# Patient Record
Sex: Female | Born: 1965 | Race: White | Hispanic: No | Marital: Married | Smoking: Never smoker
Health system: Southern US, Community
[De-identification: ages and names within clinical notes are randomized; demographics above are authoritative.]

## PROBLEM LIST (undated history)

## (undated) DIAGNOSIS — Z86010 Personal history of colonic polyps: Secondary | ICD-10-CM

## (undated) DIAGNOSIS — E785 Hyperlipidemia, unspecified: Secondary | ICD-10-CM

## (undated) DIAGNOSIS — F4310 Post-traumatic stress disorder, unspecified: Secondary | ICD-10-CM

## (undated) DIAGNOSIS — I10 Essential (primary) hypertension: Secondary | ICD-10-CM

## (undated) DIAGNOSIS — S82892A Other fracture of left lower leg, initial encounter for closed fracture: Secondary | ICD-10-CM

## (undated) DIAGNOSIS — B977 Papillomavirus as the cause of diseases classified elsewhere: Secondary | ICD-10-CM

## (undated) DIAGNOSIS — E119 Type 2 diabetes mellitus without complications: Secondary | ICD-10-CM

## (undated) HISTORY — DX: Personal history of colonic polyps: Z86.010

## (undated) HISTORY — DX: Hyperlipidemia, unspecified: E78.5

## (undated) HISTORY — DX: Post-traumatic stress disorder, unspecified: F43.10

## (undated) HISTORY — DX: Type 2 diabetes mellitus without complications: E11.9

## (undated) HISTORY — DX: Essential (primary) hypertension: I10

## (undated) HISTORY — PX: CHOLECYSTECTOMY: SHX55

## (undated) HISTORY — DX: Other fracture of left lower leg, initial encounter for closed fracture: S82.892A

## (undated) HISTORY — DX: Papillomavirus as the cause of diseases classified elsewhere: B97.7

---

## 2015-04-03 ENCOUNTER — Encounter: Payer: Self-pay | Admitting: Family Medicine

## 2015-04-03 ENCOUNTER — Ambulatory Visit (INDEPENDENT_AMBULATORY_CARE_PROVIDER_SITE_OTHER): Payer: 59 | Admitting: Family Medicine

## 2015-04-03 VITALS — BP 125/85 | HR 98 | Ht 59.0 in | Wt 182.0 lb

## 2015-04-03 DIAGNOSIS — I10 Essential (primary) hypertension: Secondary | ICD-10-CM

## 2015-04-03 DIAGNOSIS — F4310 Post-traumatic stress disorder, unspecified: Secondary | ICD-10-CM

## 2015-04-03 HISTORY — DX: Post-traumatic stress disorder, unspecified: F43.10

## 2015-04-03 HISTORY — DX: Essential (primary) hypertension: I10
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
## 2015-04-04 ENCOUNTER — Encounter: Payer: Self-pay | Admitting: Family Medicine

## 2015-04-04 DIAGNOSIS — Z860100 Personal history of colon polyps, unspecified: Secondary | ICD-10-CM

## 2015-04-04 HISTORY — DX: Personal history of colon polyps, unspecified: Z86.0100

## 2015-04-04 HISTORY — DX: Personal history of colonic polyps: Z86.010

## 2015-04-08 ENCOUNTER — Telehealth: Payer: Self-pay | Admitting: *Deleted

## 2015-04-08 DIAGNOSIS — Z0000 Encounter for general adult medical examination without abnormal findings: Secondary | ICD-10-CM

## 2015-04-08 NOTE — Telephone Encounter (Signed)
labs

## 2015-04-10 ENCOUNTER — Ambulatory Visit (INDEPENDENT_AMBULATORY_CARE_PROVIDER_SITE_OTHER): Payer: 59 | Admitting: Family Medicine

## 2015-04-10 VITALS — BP 114/79 | HR 91

## 2015-04-10 DIAGNOSIS — Z111 Encounter for screening for respiratory tuberculosis: Secondary | ICD-10-CM

## 2015-04-10 NOTE — Progress Notes (Signed)
   Subjective:    Patient ID: Connie Dyer, female    DOB: Nov 27, 1966, 49 y.o.   MRN: [id number] Pt here for a tb skin test for a new job.  Given right arm.  Pt to return Fri for read & will have her paper work.  Donne AnonAmber Suhan Paci, CMA HPI    Review of Systems     Objective:   Physical Exam        Assessment & Plan:

## 2015-04-15 ENCOUNTER — Telehealth: Payer: Self-pay | Admitting: *Deleted

## 2015-04-15 DIAGNOSIS — Z0100 Encounter for examination of eyes and vision without abnormal findings: Secondary | ICD-10-CM

## 2015-04-15 NOTE — Telephone Encounter (Signed)
referral

## 2015-05-09 ENCOUNTER — Ambulatory Visit (INDEPENDENT_AMBULATORY_CARE_PROVIDER_SITE_OTHER): Payer: 59 | Admitting: Family Medicine

## 2015-05-09 ENCOUNTER — Encounter: Payer: Self-pay | Admitting: Family Medicine

## 2015-05-09 VITALS — BP 127/79 | HR 83 | Ht 59.0 in | Wt 184.0 lb

## 2015-05-09 DIAGNOSIS — Z0000 Encounter for general adult medical examination without abnormal findings: Secondary | ICD-10-CM | POA: Diagnosis not present

## 2015-05-09 DIAGNOSIS — Z1239 Encounter for other screening for malignant neoplasm of breast: Secondary | ICD-10-CM | POA: Diagnosis not present

## 2015-05-09 DIAGNOSIS — Z124 Encounter for screening for malignant neoplasm of cervix: Secondary | ICD-10-CM

## 2015-05-09 LAB — CBC
HEMATOCRIT: 44 % (ref 36.0–46.0)
Hemoglobin: 14.6 g/dL (ref 12.0–15.0)
MCH: 29.4 pg (ref 26.0–34.0)
MCHC: 33.2 g/dL (ref 30.0–36.0)
MCV: 88.7 fL (ref 78.0–100.0)
MPV: 9.7 fL (ref 8.6–12.4)
Platelets: 281 10*3/uL (ref 150–400)
RBC: 4.96 MIL/uL (ref 3.87–5.11)
RDW: 13.7 % (ref 11.5–15.5)
WBC: 7.4 10*3/uL (ref 4.0–10.5)

## 2015-05-09 LAB — COMPLETE METABOLIC PANEL WITH GFR
ALK PHOS: 18 U/L — AB (ref 39–117)
ALT: 20 U/L (ref 0–35)
AST: 18 U/L (ref 0–37)
Albumin: 4.7 g/dL (ref 3.5–5.2)
BILIRUBIN TOTAL: 0.7 mg/dL (ref 0.2–1.2)
BUN: 20 mg/dL (ref 6–23)
CO2: 25 mEq/L (ref 19–32)
CREATININE: 0.77 mg/dL (ref 0.50–1.10)
Calcium: 10.1 mg/dL (ref 8.4–10.5)
Chloride: 98 mEq/L (ref 96–112)
GFR, Est African American: >89 mL/min
GFR, Est Non African American: >89 mL/min
GLUCOSE: 149 mg/dL — AB (ref 70–99)
POTASSIUM: 4.7 meq/L (ref 3.5–5.3)
SODIUM: 133 meq/L — AB (ref 135–145)
Total Protein: 7.9 g/dL (ref 6.0–8.3)

## 2015-05-09 LAB — LIPID PANEL
Cholesterol: 227 mg/dL — ABNORMAL HIGH (ref 0–200)
HDL: 48 mg/dL (ref >=46)
LDL CALC: 158 mg/dL — AB (ref 0–99)
Total CHOL/HDL Ratio: 4.7 Ratio
Triglycerides: 107 mg/dL (ref <150)
VLDL: 21 mg/dL (ref 0–40)

## 2015-05-09 MED ORDER — SERTRALINE HCL 100 MG PO TABS: 100.0000 mg | ORAL_TABLET | Freq: Every day | ORAL | Status: DC

## 2015-05-09 MED ORDER — HYDROCHLOROTHIAZIDE 25 MG PO TABS: 25.0000 mg | ORAL_TABLET | Freq: Every day | ORAL | Status: DC

## 2015-05-09 MED ORDER — LISINOPRIL 10 MG PO TABS: 10.0000 mg | ORAL_TABLET | Freq: Every day | ORAL | Status: DC

## 2015-05-09 NOTE — Patient Instructions (Signed)
Dr. Sherod Cisse's General Advice Following Your Complete Physical Exam  The Benefits of Regular Exercise: Unless you suffer from an uncontrolled cardiovascular condition, studies strongly suggest that regular exercise and physical activity will add to both the quality and length of your life.  The World Health Organization recommends 150 minutes of moderate intensity aerobic activity every week.  This is best split over 3-4 days a week, and can be as simple as a brisk walk for just over 35 minutes "most days of the week".  This type of exercise has been shown to lower LDL-Cholesterol, lower average blood sugars, lower blood pressure, lower cardiovascular disease risk, improve memory, and increase one's overall sense of wellbeing.  The addition of anaerobic (or "strength training") exercises offers additional benefits including but not limited to increased metabolism, prevention of osteoporosis, and improved overall cholesterol levels.  How Can I Strive For A Low-Fat Diet?: Current guidelines recommend that 25-35 percent of your daily energy (food) intake should come from fats.  One might ask how can this be achieved without having to dissect each meal on a daily basis?  Switch to skim or 1% milk instead of whole milk.  Focus on lean meats such as ground turkey, fresh fish, baked chicken, and lean cuts of beef as your source of dietary protein.  Limit saturated fat consumption to less than 10% of your daily caloric intake.  Limit trans fatty acid consumption primarily by limiting synthetic trans fats such as partially hydrogenated oils (Ex: fried fast foods).  Substitute olive or vegetable oil for solid fats where possible.  Moderation of Salt Intake: Provided you don't carry a diagnosis of congestive heart failure nor renal failure, I recommend a daily allowance of no more than 2300 mg of salt (sodium).  Keeping under this daily goal is associated with a decreased risk of cardiovascular events, creeping  above it can lead to elevated blood pressures and increases your risk of cardiovascular events.  Milligrams (mg) of salt is listed on all nutrition labels, and your daily intake can add up faster than you think.  Most canned and frozen dinners can pack in over half your daily salt allowance in one meal.    Lifestyle Health Risks: Certain lifestyle choices carry specific health risks.  As you may already know, tobacco use has been associated with increasing one's risk of cardiovascular disease, pulmonary disease, numerous cancers, among many other issues.  What you may not know is that there are medications and nicotine replacement strategies that can more than double your chances of successfully quitting.  I would be thrilled to help manage your quitting strategy if you currently use tobacco products.  When it comes to alcohol use, I've yet to find an "ideal" daily allowance.  Provided an individual does not have a medical condition that is exacerbated by alcohol consumption, general guidelines determine "safe drinking" as no more than two standard drinks for a man or no more than one standard drink for a female per day.  However, much debate still exists on whether any amount of alcohol consumption is technically "safe".  My general advice, keep alcohol consumption to a minimum for general health promotion.  If you or others believe that alcohol, tobacco, or recreational drug use is interfering with your life, I would be happy to provide confidential counseling regarding treatment options.  General "Over The Counter" Nutrition Advice: Postmenopausal women should aim for a daily calcium intake of 1200 mg, however a significant portion of this might already be   provided by diets including milk, yogurt, cheese, and other dairy products.  Vitamin D has been shown to help preserve bone density, prevent fatigue, and has even been shown to help reduce falls in the elderly.  Ensuring a daily intake of 800 Units of  Vitamin D is a good place to start to enjoy the above benefits, we can easily check your Vitamin D level to see if you'd potentially benefit from supplementation beyond 800 Units a day.  Folic Acid intake should be of particular concern to women of childbearing age.  Daily consumption of 400-800 mcg of Folic Acid is recommended to minimize the chance of spinal cord defects in a fetus should pregnancy occur.    For many adults, accidents still remain one of the most common culprits when it comes to cause of death.  Some of the simplest but most effective preventitive habits you can adopt include regular seatbelt use, proper helmet use, securing firearms, and regularly testing your smoke and carbon monoxide detectors.  Ruble Buttler B. Shateka Petrea DO [facility] [address], [address] [place], [place] [address] Phone: [phone]  

## 2015-05-09 NOTE — Progress Notes (Signed)
CC: Sherie Dobrowolski is a 49 y.o. female is here for Annual Exam   Subjective: HPI:  Colonoscopy: ten year clearance after 2015 Papsmear: She believes has been more than 3-5 years since her last Pap, will obtain today Mammogram: Last mammogram was 2 years ago and has no history of abnormals. Orders have been placed for mammogram  Influenza Vaccine: No current indication Pneumovax: No current indication Td/Tdap: UTD Zoster: (Start 51 yo)   No acute complaints  Review of Systems - General ROS: negative for - chills, fever, night sweats, weight gain or weight loss Ophthalmic ROS: negative for - decreased vision Psychological ROS: negative for - anxiety or depression ENT ROS: negative for - hearing change, nasal congestion, tinnitus or allergies Hematological and Lymphatic ROS: negative for - bleeding problems, bruising or swollen lymph nodes Breast ROS: negative Respiratory ROS: no cough, shortness of breath, or wheezing Cardiovascular ROS: no chest pain or dyspnea on exertion Gastrointestinal ROS: no abdominal pain, change in bowel habits, or black or bloody stools Genito-Urinary ROS: negative for - genital discharge, genital ulcers, incontinence or abnormal bleeding from genitals Musculoskeletal ROS: negative for - joint pain or muscle pain Neurological ROS: negative for - headaches or memory loss Dermatological ROS: negative for lumps, mole changes, rash and skin lesion changes  No past medical history on file.  Past Surgical History  Procedure Laterality Date  . Cholecystectomy     No family history on file.  History   Social History  . Marital Status: Married    Spouse Name: N/A  . Number of Children: N/A  . Years of Education: N/A   Occupational History  . Not on file.   Social History Main Topics  . Smoking status: Never Smoker   . Smokeless tobacco: Not on file  . Alcohol Use: Not on file  . Drug Use: Not on file  . Sexual Activity: Not on file   Other  Topics Concern  . Not on file   Social History Narrative     Objective: BP 127/79 mmHg  Pulse 83  Ht  (1.499 m)  Wt 184 lb (83.462 kg)  BMI 37.14 kg/m2  General: No Acute Distress HEENT: Atraumatic, normocephalic, conjunctivae normal without scleral icterus.  No nasal discharge, hearing grossly intact, TMs with good landmarks bilaterally with no middle ear abnormalities, posterior pharynx clear without oral lesions. Neck: Supple, trachea midline, no cervical nor supraclavicular adenopathy. Pulmonary: Clear to auscultation bilaterally without wheezing, rhonchi, nor rales. Cardiac: Regular rate and rhythm.  No murmurs, rubs, nor gallops. No peripheral edema.  2+ peripheral pulses bilaterally. Abdomen: Bowel sounds normal.  No masses.  Non-tender without rebound.  Negative Murphy's sign. GU:  Labia majora & minora without lesions.  Distal urethra unremarkable.  Vaginal wall integrity preserved without mucosal lesions.  Cervix non-tender and without gross lesions nor discharge.  MSK: Grossly intact, no signs of weakness.  Full strength throughout upper and lower extremities.  Full ROM in upper and lower extremities.  No midline spinal tenderness. Neuro: Gait unremarkable, CN II-XII grossly intact.  C5-C6 Reflex 2/4 Bilaterally, L4 Reflex 2/4 Bilaterally.  Cerebellar function intact. Skin: No rashes. Psych: Alert and oriented to person/place/time.  Thought process normal. No anxiety/depression.   Assessment & Plan: Connie Dyer was seen today for annual exam.  Diagnoses and all orders for this visit:  Annual physical exam Orders: -     Lipid panel -     CBC -     COMPLETE METABOLIC PANEL WITH GFR -  MM DIGITAL SCREENING BILATERAL; Future  Screening for breast cancer Orders: -     Lipid panel -     CBC -     COMPLETE METABOLIC PANEL WITH GFR -     MM DIGITAL SCREENING BILATERAL; Future  Other orders -     hydrochlorothiazide (HYDRODIURIL) 25 MG tablet; Take 1 tablet (25  mg total) by mouth daily. -     lisinopril (PRINIVIL,ZESTRIL) 10 MG tablet; Take 1 tablet (10 mg total) by mouth daily. -     sertraline (ZOLOFT) 100 MG tablet; Take 1 tablet (100 mg total) by mouth daily. Healthy lifestyle interventions including but not limited to regular exercise, a healthy low fat diet, moderation of salt intake, the dangers of tobacco/alcohol/recreational drug use, nutrition supplementation, and accident avoidance were discussed with the patient and a handout was provided for future reference.  Due for refills on antihypertensives and Zoloft   Return in about 6 months (around 11/08/2015) for Blood pressure.

## 2015-05-10 ENCOUNTER — Telehealth: Payer: Self-pay | Admitting: Family Medicine

## 2015-05-10 ENCOUNTER — Other Ambulatory Visit (HOSPITAL_COMMUNITY)
Admission: RE | Admit: 2015-05-10 | Discharge: 2015-05-10 | Disposition: A | Discharge status: Home / Self Care | Payer: 59 | Source: Ambulatory Visit | Attending: Family Medicine | Admitting: Family Medicine

## 2015-05-10 DIAGNOSIS — Z01419 Encounter for gynecological examination (general) (routine) without abnormal findings: Secondary | ICD-10-CM | POA: Insufficient documentation

## 2015-05-10 DIAGNOSIS — R739 Hyperglycemia, unspecified: Secondary | ICD-10-CM | POA: Insufficient documentation

## 2015-05-10 DIAGNOSIS — E785 Hyperlipidemia, unspecified: Secondary | ICD-10-CM

## 2015-05-10 DIAGNOSIS — R87810 Cervical high risk human papillomavirus (HPV) DNA test positive: Secondary | ICD-10-CM | POA: Diagnosis present

## 2015-05-10 DIAGNOSIS — Z1151 Encounter for screening for human papillomavirus (HPV): Secondary | ICD-10-CM | POA: Diagnosis present

## 2015-05-10 HISTORY — DX: Hyperlipidemia, unspecified: E78.5

## 2015-05-10 NOTE — Addendum Note (Signed)
Addended by: Deno Etienne on: 05/10/2015 12:23 PM   Modules accepted: Orders

## 2015-05-10 NOTE — Telephone Encounter (Signed)
Left message on patient vm with results as noted below. Verlie Liotta,CMA  

## 2015-05-10 NOTE — Telephone Encounter (Signed)
Typo: left message on patient vm to call back regarding lab results. Sherri Levenhagen,CMA

## 2015-05-10 NOTE — Telephone Encounter (Signed)
Connie Dyer, Will you please let patient know that her blood cell counts, kidney function, and liver function were all normal.  Her blood sugar was moderately elevated and I'd recommend she have a three month average blood sugar test, A1c order in your inbox in case the lab can't add it on.  Also her LDL cholesterol was moderately elevated however with her lack of cardiac risk factors cholesterol medication is not needed at this time but this may change as she gets older.  This can be improved with engaging in 30-45 minutes of moderate exercise most days of the week. 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

## 2015-05-13 NOTE — Telephone Encounter (Signed)
Message with results left on vm 

## 2015-05-14 LAB — CYTOLOGY - PAP

## 2015-05-15 ENCOUNTER — Encounter: Payer: Self-pay | Admitting: Family Medicine

## 2015-05-15 DIAGNOSIS — B977 Papillomavirus as the cause of diseases classified elsewhere: Secondary | ICD-10-CM

## 2015-05-15 HISTORY — DX: Papillomavirus as the cause of diseases classified elsewhere: B97.7

## 2015-07-23 ENCOUNTER — Ambulatory Visit (INDEPENDENT_AMBULATORY_CARE_PROVIDER_SITE_OTHER): Payer: 59 | Admitting: Family Medicine

## 2015-07-23 ENCOUNTER — Encounter: Payer: Self-pay | Admitting: Family Medicine

## 2015-07-23 VITALS — BP 126/83 | HR 84 | Wt 184.0 lb

## 2015-07-23 DIAGNOSIS — R739 Hyperglycemia, unspecified: Secondary | ICD-10-CM | POA: Diagnosis not present

## 2015-07-23 DIAGNOSIS — R109 Unspecified abdominal pain: Secondary | ICD-10-CM | POA: Diagnosis not present

## 2015-07-23 LAB — POCT URINALYSIS DIPSTICK
Bilirubin, UA: NEGATIVE
Glucose, UA: NEGATIVE
KETONES UA: NEGATIVE
Leukocytes, UA: NEGATIVE
Nitrite, UA: NEGATIVE
PH UA: 6
Protein, UA: NEGATIVE
RBC UA: NEGATIVE
SPEC GRAV UA: 1.015
Urobilinogen, UA: 0.2

## 2015-07-23 LAB — HEMOGLOBIN A1C
HEMOGLOBIN A1C: 7.4 % — AB (ref <5.7)
Mean Plasma Glucose: 166 mg/dL — ABNORMAL HIGH (ref <117)

## 2015-07-23 MED ORDER — NITROFURANTOIN MONOHYD MACRO 100 MG PO CAPS
100.0000 mg | ORAL_CAPSULE | Freq: Two times a day (BID) | ORAL | Status: AC
Start: 2015-07-23 — End: 2015-08-02

## 2015-07-23 NOTE — Progress Notes (Signed)
CC: Connie Dyer is a 49 y.o. female is here for abdominal discomfort x 3 days   Subjective: HPI:  Dysuria, urinary urgency, awakening more than usual at night to urinate. Accompanied by lower abdominal pain and pelvic discomfort that feels like past UTIs. Interventions have included increasing fluid in the diet, nothing seems to make symptoms better or worse. She feels like it's been present for 3 days and not getting better or worse. Symptoms are mild to moderate in severity. Present all hours of the day. Denies fevers, chills, constipation, diarrhea, nor any other genitourinary complaints. No flank pain.    Review Of Systems Outlined In HPI  No past medical history on file.  Past Surgical History  Procedure Laterality Date  . Cholecystectomy     No family history on file.  Social History   Social History  . Marital Status: Married    Spouse Name: N/A  . Number of Children: N/A  . Years of Education: N/A   Occupational History  . Not on file.   Social History Main Topics  . Smoking status: Never Smoker   . Smokeless tobacco: Not on file  . Alcohol Use: Not on file  . Drug Use: Not on file  . Sexual Activity: Not on file   Other Topics Concern  . Not on file   Social History Narrative     Objective: BP 126/83 mmHg  Pulse 84  Wt 184 lb (83.462 kg)  Vital signs reviewed. General: Alert and Oriented, No Acute Distress HEENT: Pupils equal, round, reactive to light. Conjunctivae clear.  External ears unremarkable.  Moist mucous membranes. Lungs: Clear and comfortable work of breathing, speaking in full sentences without accessory muscle use. Cardiac: Regular rate and rhythm.  Neuro: CN II-XII grossly intact, gait normal. Extremities: No peripheral edema.  Strong peripheral pulses.  Mental Status: No depression, anxiety, nor agitation. Logical though process. Skin: Warm and dry.  Assessment & Plan: Connie Dyer was seen today for abdominal discomfort x 3  days.  Diagnoses and all orders for this visit:  Abdominal discomfort -     Urinalysis Dipstick -     Urine Culture  Hyperglycemia -     Hemoglobin A1c  Other orders -     nitrofurantoin, macrocrystal-monohydrate, (MACROBID) 100 MG capsule; Take 1 capsule (100 mg total) by mouth 2 (two) times daily.   Symptoms are suspicious for UTI, since her analysis was inconclusive sending urine culture. Start Macrobid pending results. Overdue for A1c checked due to hyperglycemia when she was here earlier this summer.  Return if symptoms worsen or fail to improve.

## 2015-07-24 ENCOUNTER — Encounter: Payer: Self-pay | Admitting: Family Medicine

## 2015-07-24 DIAGNOSIS — E119 Type 2 diabetes mellitus without complications: Secondary | ICD-10-CM | POA: Insufficient documentation

## 2015-07-25 LAB — URINE CULTURE

## 2015-11-18 ENCOUNTER — Telehealth: Payer: Self-pay

## 2015-11-18 ENCOUNTER — Ambulatory Visit: Payer: 59 | Admitting: Family Medicine

## 2015-11-18 DIAGNOSIS — S82892A Other fracture of left lower leg, initial encounter for closed fracture: Secondary | ICD-10-CM

## 2015-11-18 NOTE — Telephone Encounter (Signed)
Sent a referral for left ankle fracture. She was [facility].

## 2015-11-26 ENCOUNTER — Encounter: Payer: Self-pay | Admitting: Family Medicine

## 2015-11-26 ENCOUNTER — Ambulatory Visit (INDEPENDENT_AMBULATORY_CARE_PROVIDER_SITE_OTHER): Payer: 59 | Admitting: Family Medicine

## 2015-11-26 VITALS — BP 126/66 | HR 99 | Wt 183.0 lb

## 2015-11-26 DIAGNOSIS — T148 Other injury of unspecified body region: Secondary | ICD-10-CM

## 2015-11-26 DIAGNOSIS — IMO0002 Reserved for concepts with insufficient information to code with codable children: Secondary | ICD-10-CM

## 2015-11-26 NOTE — Patient Instructions (Signed)
Thank you for coming in today. Return to Dr. Rexene EdisonH as needed.  We will be happy to take over the ankle if desired.

## 2015-11-26 NOTE — Progress Notes (Signed)
       Connie Dyer is a 49 y.o. female who [facility] today for staple removal.  Patient sustained a fall on the 18th suffering from a R gluteal fold laceration repaired with 3 staples and a L ankle fracture. She does not note any purulence or bleeding from laceration site. Denies fevers and chills. Patient is irritated by the staples while sitting and is scheduled to have them removed. Patient is being followed for her fracture with Dr. Orson AloeHenderson.    No past medical history on file. Past Surgical History  Procedure Laterality Date  . Cholecystectomy     Social History  Substance Use Topics  . Smoking status: Never Smoker   . Smokeless tobacco: Not on file  . Alcohol Use: Not on file   family history is not on file.  ROS as above Medications: Current Outpatient Prescriptions  Medication Sig Dispense Refill  . hydrochlorothiazide (HYDRODIURIL) 25 MG tablet Take 1 tablet (25 mg total) by mouth daily. 90 tablet 2  . lisinopril (PRINIVIL,ZESTRIL) 10 MG tablet Take 1 tablet (10 mg total) by mouth daily. 90 tablet 2  . sertraline (ZOLOFT) 100 MG tablet Take 1 tablet (100 mg total) by mouth daily. 90 tablet 2   No current facility-administered medications for this visit.   No Known Allergies   Exam:  BP 126/66 mmHg  Pulse 99  Wt 183 lb (83.008 kg) Gen: Well NAD Left ankle: Well appearing short leg cast.  Skin: Appearing 3 staples easily removed. No skin exudates or significant erythema or tenderness  No results found for this or any previous visit (from the past 24 hour(s)). No results found.   49 year old woman with laceration status post staple removal. Doing well. Return as needed. Ankle fracture doing well. Continue management per orthopedics for with myself

## 2015-12-03 ENCOUNTER — Ambulatory Visit (INDEPENDENT_AMBULATORY_CARE_PROVIDER_SITE_OTHER): Payer: Managed Care, Other (non HMO)

## 2015-12-03 ENCOUNTER — Encounter: Payer: Self-pay | Admitting: Family Medicine

## 2015-12-03 ENCOUNTER — Ambulatory Visit (INDEPENDENT_AMBULATORY_CARE_PROVIDER_SITE_OTHER): Payer: Managed Care, Other (non HMO) | Admitting: Family Medicine

## 2015-12-03 VITALS — BP 126/54 | HR 102 | Wt 183.0 lb

## 2015-12-03 DIAGNOSIS — X58XXXD Exposure to other specified factors, subsequent encounter: Secondary | ICD-10-CM

## 2015-12-03 DIAGNOSIS — S82432D Displaced oblique fracture of shaft of left fibula, subsequent encounter for closed fracture with routine healing: Secondary | ICD-10-CM

## 2015-12-03 DIAGNOSIS — S82892D Other fracture of left lower leg, subsequent encounter for closed fracture with routine healing: Secondary | ICD-10-CM | POA: Diagnosis not present

## 2015-12-03 DIAGNOSIS — S82892A Other fracture of left lower leg, initial encounter for closed fracture: Secondary | ICD-10-CM

## 2015-12-03 HISTORY — DX: Other fracture of left lower leg, initial encounter for closed fracture: S82.892A

## 2015-12-03 NOTE — Patient Instructions (Addendum)
Thank you for coming in today. We think the fracture of your ankle is displaced and you should see a surgeon about options for management. Please contact the office below to ensure oyu have an appointment for tomorrow 1/4 at 1pm.   Delbert HarnessMurphy [facility]: Nilda SimmerWainer Robert A MD Address: [address], [place], [place] [address]  [phone]

## 2015-12-03 NOTE — Progress Notes (Signed)
       Connie Dyer is a 50 y.o. female who [facility] today for discuss ankle fracture. She was recently seen as a work in visit to remove a stable and her buttocks. She presents today to manage her ankle fracture. She fell on or about December 18 and fractured her left ankle. She has been managed with casting and nonweightbearing status. She's here today for infection follow-up. She notes the pain is moderate but throbbing at times. Elevation of her foot is helpful. 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   No past medical history on file. Past Surgical History  Procedure Laterality Date  . Cholecystectomy     Social History  Substance Use Topics  . Smoking status: Never Smoker   . Smokeless tobacco: Not on file  . Alcohol Use: Not on file   family history is not on file.  ROS as above Medications: Current Outpatient Prescriptions  Medication Sig Dispense Refill  . hydrochlorothiazide (HYDRODIURIL) 25 MG tablet Take 1 tablet (25 mg total) by mouth daily. 90 tablet 2  . lisinopril (PRINIVIL,ZESTRIL) 10 MG tablet Take 1 tablet (10 mg total) by mouth daily. 90 tablet 2  . sertraline (ZOLOFT) 100 MG tablet Take 1 tablet (100 mg total) by mouth daily. 90 tablet 2   No current facility-administered medications for this visit.   No Known Allergies   Exam:  BP 126/54 mmHg  Pulse 102  Wt 183 lb (83.008 kg)  Gen: Well NAD This ankle with mild ecchymosis. Swelling is minimal. Pulses capillary refill and sensation are intact distally.  No results found for this or any previous visit (from the past 24 hour(s)). Dg Ankle Complete Left  12/03/2015  CLINICAL DATA:  Follow-up left ankle fracture. Fracture diagnosed 2 weeks ago. EXAM: LEFT ANKLE COMPLETE - 3+ VIEW COMPARISON:  None. FINDINGS: There is an oblique fracture of the distal fibula.  Fractures non comminuted. There is minimal posterior and lateral displacement 2.7 mm. No medial malleolar fracture. Ankle mortise is normally spaced and aligned. There is a subtle nondisplaced fracture of the posterior medially this, along the posterior margin of the distal tibia, without comminution. Mild soft tissue edema is noted. IMPRESSION: 1. Oblique fracture of the distal fibula, non comminuted, with minimal, 2.7 mm, of displacement. 2. Subtle nondisplaced fracture of the posterior malleolus. 3. No other fractures.  Ankle mortise is normally aligned. Electronically Signed   By: Amie Portlandavid  Ormond M.D.   On: 12/03/2015 15:11     Please see individual assessment and plan sections.

## 2015-12-03 NOTE — Assessment & Plan Note (Signed)
Displaced and disrupting the mortise on x-ray today. D[facility]ed with [facility] [place]. Referral to Dr. Thurston HoleWainer for surgery.

## 2016-02-10 ENCOUNTER — Other Ambulatory Visit: Payer: Self-pay

## 2016-02-10 MED ORDER — LISINOPRIL 10 MG PO TABS: 10.0000 mg | ORAL_TABLET | Freq: Every day | ORAL | Status: DC

## 2016-02-10 MED ORDER — HYDROCHLOROTHIAZIDE 25 MG PO TABS: 25.0000 mg | ORAL_TABLET | Freq: Every day | ORAL | Status: DC

## 2016-02-10 MED ORDER — SERTRALINE HCL 100 MG PO TABS: 100.0000 mg | ORAL_TABLET | Freq: Every day | ORAL | Status: DC

## 2016-02-19 ENCOUNTER — Other Ambulatory Visit: Payer: Self-pay

## 2016-02-19 MED ORDER — SERTRALINE HCL 100 MG PO TABS: 100.0000 mg | ORAL_TABLET | Freq: Every day | ORAL | Status: DC

## 2016-02-19 MED ORDER — LISINOPRIL 10 MG PO TABS: 10.0000 mg | ORAL_TABLET | Freq: Every day | ORAL | Status: DC

## 2016-02-19 MED ORDER — HYDROCHLOROTHIAZIDE 25 MG PO TABS: 25.0000 mg | ORAL_TABLET | Freq: Every day | ORAL | Status: DC

## 2016-03-03 ENCOUNTER — Other Ambulatory Visit: Payer: Self-pay | Admitting: Family Medicine

## 2016-03-05 ENCOUNTER — Telehealth: Payer: Self-pay | Admitting: Family Medicine

## 2016-03-05 NOTE — Telephone Encounter (Signed)
I called pt and left a message for patient to call the office to schedule appt with Dr.Hommel for a f/u on her HTN and DM

## 2016-03-10 ENCOUNTER — Ambulatory Visit (INDEPENDENT_AMBULATORY_CARE_PROVIDER_SITE_OTHER): Payer: Managed Care, Other (non HMO) | Admitting: Family Medicine

## 2016-03-10 ENCOUNTER — Encounter: Payer: Self-pay | Admitting: Family Medicine

## 2016-03-10 DIAGNOSIS — Z0289 Encounter for other administrative examinations: Secondary | ICD-10-CM

## 2016-03-10 NOTE — Progress Notes (Signed)
No show 03/10/2016

## 2016-03-16 ENCOUNTER — Encounter: Payer: Self-pay | Admitting: Family Medicine

## 2016-03-16 ENCOUNTER — Ambulatory Visit (INDEPENDENT_AMBULATORY_CARE_PROVIDER_SITE_OTHER): Payer: Managed Care, Other (non HMO) | Admitting: Family Medicine

## 2016-03-16 VITALS — BP 127/84 | HR 96 | Wt 178.0 lb

## 2016-03-16 DIAGNOSIS — E119 Type 2 diabetes mellitus without complications: Secondary | ICD-10-CM

## 2016-03-16 DIAGNOSIS — S93402A Sprain of unspecified ligament of left ankle, initial encounter: Secondary | ICD-10-CM

## 2016-03-16 DIAGNOSIS — R739 Hyperglycemia, unspecified: Secondary | ICD-10-CM | POA: Diagnosis not present

## 2016-03-16 DIAGNOSIS — I10 Essential (primary) hypertension: Secondary | ICD-10-CM

## 2016-03-16 LAB — POCT GLYCOSYLATED HEMOGLOBIN (HGB A1C): HEMOGLOBIN A1C: 7.1

## 2016-03-16 MED ORDER — LISINOPRIL 10 MG PO TABS: 10.0000 mg | ORAL_TABLET | Freq: Every day | ORAL | Status: DC

## 2016-03-16 MED ORDER — HYDROCHLOROTHIAZIDE 25 MG PO TABS: 25.0000 mg | ORAL_TABLET | Freq: Every day | ORAL | Status: DC

## 2016-03-16 MED ORDER — SERTRALINE HCL 100 MG PO TABS: 100.0000 mg | ORAL_TABLET | Freq: Every day | ORAL | Status: DC

## 2016-03-16 NOTE — Progress Notes (Signed)
CC: Connie Dyer is a 50 y.o. female is here for Medication Refill and Ankle Pain   Subjective: HPI:  Follow-up essential hypertension: She is taking lisinopril and hydrochlorothiazide with 100% compliance with no outside blood pressures to report. She denies chest pain shortness of breath orthopnea nor peripheral edema. She is trying to stay active  Follow-up hyperglycemia: She's been trying to cut out carbohydrates in her diet. She denies vision loss, polyuria or polyphagia or polydipsia.  Over this weekend she sustained an inversion injury hiking that affected her left ankle. She was able to ambulate at the time of the accident and can ambulate today. Pain is localized just inferior and anterior to the medial malleolus. Its painful with any inversion or twisting of the ankle. She denies any pain with plantar or dorsiflexion. There's been some mild bruising that just showed up this morning. She denies any difficulty with bearing weight. Denies joint pain elsewhere   Review Of Systems Outlined In HPI  No past medical history on file.  Past Surgical History  Procedure Laterality Date  . Cholecystectomy     No family history on file.  Social History   Social History  . Marital Status: Married    Spouse Name: N/A  . Number of Children: N/A  . Years of Education: N/A   Occupational History  . Not on file.   Social History Main Topics  . Smoking status: Never Smoker   . Smokeless tobacco: Not on file  . Alcohol Use: Not on file  . Drug Use: Not on file  . Sexual Activity: Not on file   Other Topics Concern  . Not on file   Social History Narrative     Objective: BP 127/84 mmHg  Pulse 96  Wt 178 lb (80.74 kg)  General: Alert and Oriented, No Acute Distress HEENT: Pupils equal, round, reactive to light. Conjunctivae clear.  Moist mucous membranes Lungs: Clear to auscultation bilaterally, no wheezing/ronchi/rales.  Comfortable work of breathing. Good air  movement. Cardiac: Regular rate and rhythm. Normal S1/S2.  No murmurs, rubs, nor gallops.   Extremities: No peripheral edema.  Strong peripheral pulses. Pain is reproducible with palpation of the left ATF ligament. No pain at the lateral malleoli. Full range of motion and strength of the left ankle.  Mental Status: No depression, anxiety, nor agitation. Skin: Warm and dry.  Assessment & Plan: Connie Dyer was seen today for medication refill and ankle pain.  Diagnoses and all orders for this visit:  Essential hypertension -     hydrochlorothiazide (HYDRODIURIL) 25 MG tablet; Take 1 tablet (25 mg total) by mouth daily. -     lisinopril (PRINIVIL,ZESTRIL) 10 MG tablet; Take 1 tablet (10 mg total) by mouth daily.  Hyperglycemia  Type 2 diabetes mellitus without complication, without long-term current use of insulin (HCC)  Left ankle sprain, initial encounter  Other orders -     sertraline (ZOLOFT) 100 MG tablet; Take 1 tablet (100 mg total) by mouth daily.   Essential hypertension: Controlled with hydrochlorothiazide and lisinopril Hyperglycemia: A1c of 7.1, uncontrolled type 2 diabetes therefore stressed the importance of reducing carbohydrate intake and increasing physical activity once the ankle gets better. Left ankle sprain: Low suspicion for fracture return of the left ankle therefore discussed range of motion exercises and RICE  Return in about 3 months (around 06/15/2016) for Blood sugar and presssure.

## 2016-03-16 NOTE — Addendum Note (Signed)
Addended by: Thom ChimesHENRY, Sunya Humbarger M on: 03/16/2016 09:44 AM   Modules accepted: Orders

## 2016-03-18 ENCOUNTER — Ambulatory Visit (INDEPENDENT_AMBULATORY_CARE_PROVIDER_SITE_OTHER): Payer: Managed Care, Other (non HMO)

## 2016-03-18 DIAGNOSIS — Z1231 Encounter for screening mammogram for malignant neoplasm of breast: Secondary | ICD-10-CM

## 2016-03-18 DIAGNOSIS — Z1239 Encounter for other screening for malignant neoplasm of breast: Secondary | ICD-10-CM

## 2016-03-18 DIAGNOSIS — Z0000 Encounter for general adult medical examination without abnormal findings: Secondary | ICD-10-CM

## 2016-04-01 ENCOUNTER — Ambulatory Visit (INDEPENDENT_AMBULATORY_CARE_PROVIDER_SITE_OTHER): Payer: Managed Care, Other (non HMO) | Admitting: Family Medicine

## 2016-04-01 ENCOUNTER — Encounter: Payer: Self-pay | Admitting: Family Medicine

## 2016-04-01 VITALS — BP 145/83 | HR 90 | Temp 98.4°F | Wt 182.0 lb

## 2016-04-01 DIAGNOSIS — J069 Acute upper respiratory infection, unspecified: Secondary | ICD-10-CM | POA: Diagnosis not present

## 2016-04-01 NOTE — Progress Notes (Signed)
CC: Connie Dyer is a 50 y.o. female is here for URI   Subjective: HPI:  Scratchy throat, nonproductive cough, fatigue present ever since Monday night. Not getting better or worse. She's felt fatigued to the point where she is unable to do simple things at work. She denies fevers but may be experiencing some chills. She denies any shortness of breath or wheezing. No chest discomfort. She denies any blood in sputum. She denies any facial pressure or nasal congestion. No benefit from Coricidin. No other interventions as of yet. Symptoms are worse at night.   Review Of Systems Outlined In HPI  No past medical history on file.  Past Surgical History  Procedure Laterality Date  . Cholecystectomy     No family history on file.  Social History   Social History  . Marital Status: Married    Spouse Name: N/A  . Number of Children: N/A  . Years of Education: N/A   Occupational History  . Not on file.   Social History Main Topics  . Smoking status: Never Smoker   . Smokeless tobacco: Not on file  . Alcohol Use: Not on file  . Drug Use: Not on file  . Sexual Activity: Not on file   Other Topics Concern  . Not on file   Social History Narrative     Objective: BP 145/83 mmHg  Pulse 90  Temp(Src) 98.4 F (36.9 C) (Oral)  Wt 182 lb (82.555 kg)  SpO2 98%  LMP 03/04/2016  General: Alert and Oriented, No Acute Distress HEENT: Pupils equal, round, reactive to light. Conjunctivae clear.  External ears unremarkable, canals clear with intact TMs with appropriate landmarks.  Middle ear appears open without effusion. Pink inferior turbinates.  Moist mucous membranes, pharynx without inflammation nor lesions.  Neck supple without palpable lymphadenopathy nor abnormal masses. Lungs: Clear to auscultation bilaterally, no wheezing/ronchi/rales.  Comfortable work of breathing. Good air movement. Cardiac: Regular rate and rhythm. Normal S1/S2.  No murmurs, rubs, nor gallops.   Extremities:  No peripheral edema.  Strong peripheral pulses.  Mental Status: No depression, anxiety, nor agitation. Skin: Warm and dry.  Assessment & Plan: Connie Dyer was seen today for uri.  Diagnoses and all orders for this visit:  Viral URI   Discussed vitamin C, zinc, fluids, and rest. 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  Return if symptoms worsen or fail to improve.

## 2016-06-10 ENCOUNTER — Ambulatory Visit: Payer: Managed Care, Other (non HMO) | Admitting: Family Medicine

## 2016-10-19 ENCOUNTER — Ambulatory Visit (INDEPENDENT_AMBULATORY_CARE_PROVIDER_SITE_OTHER): Payer: Managed Care, Other (non HMO) | Admitting: Family Medicine

## 2016-10-19 ENCOUNTER — Encounter: Payer: Self-pay | Admitting: Family Medicine

## 2016-10-19 VITALS — BP 125/86 | HR 86 | Temp 98.1°F | Wt 175.0 lb

## 2016-10-19 DIAGNOSIS — I10 Essential (primary) hypertension: Secondary | ICD-10-CM

## 2016-10-19 DIAGNOSIS — H8112 Benign paroxysmal vertigo, left ear: Secondary | ICD-10-CM

## 2016-10-19 DIAGNOSIS — E119 Type 2 diabetes mellitus without complications: Secondary | ICD-10-CM | POA: Diagnosis not present

## 2016-10-19 DIAGNOSIS — Z1159 Encounter for screening for other viral diseases: Secondary | ICD-10-CM

## 2016-10-19 DIAGNOSIS — E782 Mixed hyperlipidemia: Secondary | ICD-10-CM | POA: Diagnosis not present

## 2016-10-19 DIAGNOSIS — Z114 Encounter for screening for human immunodeficiency virus [HIV]: Secondary | ICD-10-CM

## 2016-10-19 LAB — LIPID PANEL
CHOLESTEROL: 205 mg/dL — AB (ref <200)
HDL: 44 mg/dL — AB (ref >50)
LDL CALC: 131 mg/dL — AB (ref <100)
TRIGLYCERIDES: 149 mg/dL (ref <150)
Total CHOL/HDL Ratio: 4.7 Ratio (ref <5.0)
VLDL: 30 mg/dL (ref <30)

## 2016-10-19 LAB — COMPLETE METABOLIC PANEL WITH GFR
ALBUMIN: 4.7 g/dL (ref 3.6–5.1)
ALK PHOS: 19 U/L — AB (ref 33–130)
ALT: 14 U/L (ref 6–29)
AST: 16 U/L (ref 10–35)
BUN: 14 mg/dL (ref 7–25)
CALCIUM: 9.5 mg/dL (ref 8.6–10.4)
CO2: 27 mmol/L (ref 20–31)
Chloride: 98 mmol/L (ref 98–110)
Creat: 0.8 mg/dL (ref 0.50–1.05)
GFR, EST NON AFRICAN AMERICAN: 85 mL/min (ref >=60)
Glucose, Bld: 193 mg/dL — ABNORMAL HIGH (ref 65–99)
POTASSIUM: 4.2 mmol/L (ref 3.5–5.3)
Sodium: 134 mmol/L — ABNORMAL LOW (ref 135–146)
Total Bilirubin: 0.6 mg/dL (ref 0.2–1.2)
Total Protein: 7.9 g/dL (ref 6.1–8.1)

## 2016-10-19 LAB — HIV ANTIBODY (ROUTINE TESTING W REFLEX): HIV 1&2 Ab, 4th Generation: NONREACTIVE

## 2016-10-19 LAB — CBC
HEMATOCRIT: 45.6 % — AB (ref 35.0–45.0)
HEMOGLOBIN: 15 g/dL (ref 11.7–15.5)
MCH: 30.2 pg (ref 27.0–33.0)
MCHC: 32.9 g/dL (ref 32.0–36.0)
MCV: 91.9 fL (ref 80.0–100.0)
MPV: 9.7 fL (ref 7.5–12.5)
Platelets: 265 10*3/uL (ref 140–400)
RBC: 4.96 MIL/uL (ref 3.80–5.10)
RDW: 13.1 % (ref 11.0–15.0)
WBC: 7.7 10*3/uL (ref 3.8–10.8)

## 2016-10-19 LAB — HEPATITIS C ANTIBODY: HCV Ab: NEGATIVE

## 2016-10-19 MED ORDER — PROMETHAZINE HCL 25 MG PO TABS: 25.0000 mg | ORAL_TABLET | Freq: Three times a day (TID) | ORAL | 0 refills | Status: DC | PRN

## 2016-10-19 MED ORDER — IPRATROPIUM BROMIDE 0.06 % NA SOLN: 2.0000 | NASAL | 6 refills | Status: DC | PRN

## 2016-10-19 NOTE — Patient Instructions (Addendum)
Thank you for coming in today. Use Phenergan sparingly for nausea or vomiting or dizziness. Use Atrovent nasal spray. Follow-up with vestibular physical therapy if not better.    Benign Positional Vertigo Introduction Vertigo is the feeling that you or your surroundings are moving when they are not. Benign positional vertigo is the most common form of vertigo. The cause of this condition is not serious (is benign). This condition is triggered by certain movements and positions (is positional). This condition can be dangerous if it occurs while you are doing something that could endanger you or others, such as driving. What are the causes? In many cases, the cause of this condition is not known. It may be caused by a disturbance in an area of the inner ear that helps your brain to sense movement and balance. This disturbance can be caused by a viral infection (labyrinthitis), head injury, or repetitive motion. What increases the risk? This condition is more likely to develop in:  Women.  People who are 52 years of age or older. What are the signs or symptoms? Symptoms of this condition usually happen when you move your head or your eyes in different directions. Symptoms may start suddenly, and they usually last for less than a minute. Symptoms may include:  Loss of balance and falling.  Feeling like you are spinning or moving.  Feeling like your surroundings are spinning or moving.  Nausea and vomiting.  Blurred vision.  Dizziness.  Involuntary eye movement (nystagmus). Symptoms can be mild and cause only slight annoyance, or they can be severe and interfere with daily life. Episodes of benign positional vertigo may return (recur) over time, and they may be triggered by certain movements. Symptoms may improve over time. How is this diagnosed? This condition is usually diagnosed by medical history and a physical exam of the head, neck, and ears. You may be referred to a health care  provider who specializes in ear, nose, and throat (ENT) problems (otolaryngologist) or a provider who specializes in disorders of the nervous system (neurologist). You may have additional testing, including:  MRI.  A CT scan.  Eye movement tests. Your health care provider may ask you to change positions quickly while he or she watches you for symptoms of benign positional vertigo, such as nystagmus. Eye movement may be tested with an electronystagmogram (ENG), caloric stimulation, the Dix-Hallpike test, or the roll test.  An electroencephalogram (EEG). This records electrical activity in your brain.  Hearing tests. How is this treated? Usually, your health care provider will treat this by moving your head in specific positions to adjust your inner ear back to normal. Surgery may be needed in severe cases, but this is rare. In some cases, benign positional vertigo may resolve on its own in 2-4 weeks. Follow these instructions at home: Safety  Move slowly.Avoid sudden body or head movements.  Avoid driving.  Avoid operating heavy machinery.  Avoid doing any tasks that would be dangerous to you or others if a vertigo episode would occur.  If you have trouble walking or keeping your balance, try using a cane for stability. If you feel dizzy or unstable, sit down right away.  Return to your normal activities as told by your health care provider. Ask your health care provider what activities are safe for you. General instructions  Take over-the-counter and prescription medicines only as told by your health care provider.  Avoid certain positions or movements as told by your health care provider.  Drink enough  fluid to keep your urine clear or pale yellow.  Keep all follow-up visits as told by your health care provider. This is important. Contact a health care provider if:  You have a fever.  Your condition gets worse or you develop new symptoms.  Your family or friends notice any  behavioral changes.  Your nausea or vomiting gets worse.  You have numbness or a "pins and needles" sensation. Get help right away if:  You have difficulty speaking or moving.  You are always dizzy.  You faint.  You develop severe headaches.  You have weakness in your legs or arms.  You have changes in your hearing or vision.  You develop a stiff neck.  You develop sensitivity to light. This information is not intended to replace advice given to you by your health care provider. Make sure you discuss any questions you have with your health care provider. Document Released: 08/24/2006 Document Revised: 04/23/2016 Document Reviewed: 03/11/2015  2017 Elsevier

## 2016-10-19 NOTE — Progress Notes (Signed)
Connie Dyer is a 50 y.o. female who [facility] today for nasal congestion, dizziness, and vomiting x1.  She has had a sore throat and nasal congestion that started 3.5 weeks ago. 1 week ago, the sore throat went away and was replaced with sinus pressure and a productive cough with clear mucus. For the past few days, she has had a few episodes of watery, non-bloody diarrhea. She did not drink any water yesterday. Took some Sudafed last night with some relief of her congestion. This morning in the shower, she felt like the room was spinning. She drank some water, felt nauseous, and vomited. Still feeling nauseous now. No fever, recent antibiotics, diet changes, urinary changes.  She is a former patient of Dr. Genelle BalHommel's and was managed for HTN on HCTZ and lisinopril, DM with no meds, and depression on zoloft. Most recent labs from 1 year ago.  No past medical history on file. Past Surgical History:  Procedure Laterality Date  . CHOLECYSTECTOMY     Social History  Substance Use Topics  . Smoking status: Never Smoker  . Smokeless tobacco: Not on file  . Alcohol use Not on file   family history is not on file.  ROS as above:  Medications: Current Outpatient Prescriptions  Medication Sig Dispense Refill  . hydrochlorothiazide (HYDRODIURIL) 25 MG tablet Take 1 tablet (25 mg total) by mouth daily. 90 tablet 1  . lisinopril (PRINIVIL,ZESTRIL) 10 MG tablet Take 1 tablet (10 mg total) by mouth daily. 90 tablet 1  . sertraline (ZOLOFT) 100 MG tablet Take 1 tablet (100 mg total) by mouth daily. 90 tablet 1  . ipratropium (ATROVENT) 0.06 % nasal spray Place 2 sprays into both nostrils every 4 (four) hours as needed for rhinitis. 10 mL 6  . promethazine (PHENERGAN) 25 MG tablet Take 1 tablet (25 mg total) by mouth every 8 (eight) hours as needed for nausea or  vomiting. 20 tablet 0   No current facility-administered medications for this visit.    No Known Allergies  Health Maintenance Health Maintenance  Topic Date Due  . Hepatitis C Screening  [birth year]  . PNEUMOCOCCAL POLYSACCHARIDE VACCINE (1) 06/10/1966  . FOOT EXAM  06/10/1974  . OPHTHALMOLOGY EXAM  06/10/1974  . HIV Screening  06/11/1979  . INFLUENZA VACCINE  06/30/2016  . HEMOGLOBIN A1C  09/15/2016  . MAMMOGRAM  03/18/2018  . PAP SMEAR  05/09/2018  . COLONOSCOPY  07/31/2024  . TETANUS/TDAP  11/16/2025     Exam:  BP 125/86   Pulse 86   Temp 98.1 F (36.7 C) (Oral)   Wt 175 lb (79.4 kg)   BMI 35.35 kg/m   Orthostatic VS for the past 24 hrs:  BP- Lying Pulse- Lying BP- Sitting Pulse- Sitting BP- Standing at 0 minutes Pulse- Standing at 0 minutes  10/19/16 0830 124/63 82 126/83 83 124/71 87      Gen: Well NAD HEENT: No sinus tenderness to palpation, conjunctiva clear, EOMI, tympanic membranes gray and translucent bilaterally, nasal turbinates mildly erythematous, MMM, throat non-erythematous, no cervical lymphadenopathy Lungs: Normal work of breathing. CTABL Heart: RRR no MRG Abd: NABS, Soft. Nondistended, Nontender Exts: Brisk capillary refill, warm and well perfused.  Neuro: Modified Dix-Hallpike positive on left side, mild nystagmus present    No results found for this or any previous visit (from the past 72 hour(s)). No results found.    Assessment and Plan: 50 y.o. female with  HTN, DM, and depression who presents with several weeks of nasal congestion and recent dizziness.    Viral URI: Sore throat, nasal congestion, cough without signs concerning for bacterial infection. Atrovent nasal spray for congestion.  Dizziness: Likely BPPV given positive Dix-Hallpike on left side, nystagamus; associated with nausea. Use phenergan, refer to vestibular PT.       No orders of the defined types were placed in this encounter.   Discussed warning signs or  symptoms. Please see discharge instructions. Patient expresses understanding.

## 2016-10-20 LAB — HEMOGLOBIN A1C
HEMOGLOBIN A1C: 6.6 % — AB (ref <5.7)
MEAN PLASMA GLUCOSE: 143 mg/dL

## 2016-11-18 ENCOUNTER — Ambulatory Visit: Payer: Managed Care, Other (non HMO) | Admitting: Physician Assistant

## 2017-04-29 ENCOUNTER — Other Ambulatory Visit: Payer: Self-pay

## 2017-04-29 DIAGNOSIS — I10 Essential (primary) hypertension: Secondary | ICD-10-CM

## 2017-04-29 MED ORDER — HYDROCHLOROTHIAZIDE 25 MG PO TABS: 25.0000 mg | ORAL_TABLET | Freq: Every day | ORAL | 0 refills | Status: DC

## 2017-04-29 MED ORDER — LISINOPRIL 10 MG PO TABS
10.0000 mg | ORAL_TABLET | Freq: Every day | ORAL | 0 refills | Status: DC
Start: 2017-04-29 — End: 2017-07-13

## 2017-07-13 ENCOUNTER — Ambulatory Visit (INDEPENDENT_AMBULATORY_CARE_PROVIDER_SITE_OTHER): Payer: Managed Care, Other (non HMO) | Admitting: Physician Assistant

## 2017-07-13 ENCOUNTER — Encounter: Payer: Self-pay | Admitting: Physician Assistant

## 2017-07-13 VITALS — BP 121/78 | HR 102 | Ht 60.0 in | Wt 179.0 lb

## 2017-07-13 DIAGNOSIS — F4310 Post-traumatic stress disorder, unspecified: Secondary | ICD-10-CM

## 2017-07-13 DIAGNOSIS — I10 Essential (primary) hypertension: Secondary | ICD-10-CM | POA: Diagnosis not present

## 2017-07-13 DIAGNOSIS — E119 Type 2 diabetes mellitus without complications: Secondary | ICD-10-CM

## 2017-07-13 HISTORY — DX: Type 2 diabetes mellitus without complications: E11.9

## 2017-07-13 LAB — POCT GLYCOSYLATED HEMOGLOBIN (HGB A1C): Hemoglobin A1C: 7.9

## 2017-07-13 MED ORDER — HYDROCHLOROTHIAZIDE 25 MG PO TABS: 25.0000 mg | ORAL_TABLET | Freq: Every day | ORAL | 0 refills | Status: DC

## 2017-07-13 MED ORDER — LISINOPRIL 10 MG PO TABS: 10.0000 mg | ORAL_TABLET | Freq: Every day | ORAL | 0 refills | Status: DC

## 2017-07-13 MED ORDER — HYDROCHLOROTHIAZIDE 25 MG PO TABS: 25.0000 mg | ORAL_TABLET | Freq: Every day | ORAL | 1 refills | Status: DC

## 2017-07-13 MED ORDER — SERTRALINE HCL 100 MG PO TABS: 100.0000 mg | ORAL_TABLET | Freq: Every day | ORAL | 1 refills | Status: DC

## 2017-07-13 MED ORDER — SITAGLIP PHOS-METFORMIN HCL ER 100-1000 MG PO TB24: 1.0000 | ORAL_TABLET | Freq: Every day | ORAL | 0 refills | Status: DC

## 2017-07-13 MED ORDER — LISINOPRIL 10 MG PO TABS: 10.0000 mg | ORAL_TABLET | Freq: Every day | ORAL | 1 refills | Status: DC

## 2017-07-13 NOTE — Progress Notes (Signed)
   Subjective:    Patient ID: Connie Dyer, female    DOB: [birth year], 51 y.o.   MRN: [id number]  HPI Pt is a 51 yo female who presents to the clinic for follow up and medication refills.   HTN- doing well with no concerns. Taking medication daily. No CP, palpitations, headaches, or vision changes.   PTSD- controlled for years on zoloft does not want to make any changes to her medication.   Hx of pre-diabetes- she has not had A!C checked since novemeber at was 6.5 at that time but never followed up. Denies any polydipsia or polyuria.   .. Active Ambulatory Problems    Diagnosis Date Noted  . Essential hypertension 04/03/2015  . PTSD (post-traumatic stress disorder) 04/03/2015  . History of colonic polyps 04/04/2015  . Hyperlipidemia 05/10/2015  . Hyperglycemia 05/10/2015  . 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 Type 2 diabetes mellitus (HCC) 07/24/2015  . Ankle fracture, left 12/03/2015   Resolved Ambulatory Problems    Diagnosis Date Noted  . No Resolved Ambulatory Problems   No Additional Past Medical History        Review of Systems See HPI.     Objective:   Physical Exam  Constitutional: She is oriented to person, place, and time. She appears well-developed and well-nourished.  HENT:  Head: Normocephalic and atraumatic.  Cardiovascular: Normal rate, regular rhythm and normal heart sounds.   Pulmonary/Chest: Effort normal and breath sounds normal.  Neurological: She is alert and oriented to person, place, and time.  Psychiatric: She has a normal mood and affect. Her behavior is normal.          Assessment & Plan:  Marland Kitchen.Marland Kitchen.Elease Hashimotoatricia was seen today for follow-up.  Diagnoses and all orders for this visit:  Essential hypertension -     Discontinue: hydrochlorothiazide (HYDRODIURIL) 25 MG tablet; Take 1 tablet (25 mg total) by mouth daily. -     Discontinue: lisinopril (PRINIVIL,ZESTRIL) 10 MG tablet; Take 1 tablet (10 mg total) by mouth daily. -      hydrochlorothiazide (HYDRODIURIL) 25 MG tablet; Take 1 tablet (25 mg total) by mouth daily. -     lisinopril (PRINIVIL,ZESTRIL) 10 MG tablet; Take 1 tablet (10 mg total) by mouth daily.  PTSD (post-traumatic stress disorder)  Diabetes mellitus without complication (HCC) -     POCT HgB A1C  Other orders -     sertraline (ZOLOFT) 100 MG tablet; Take 1 tablet (100 mg total) by mouth daily. -     SitaGLIPtin-MetFORMIN HCl [id number] MG TB24; Take 1 tablet by mouth daily.   Refilled BP medication for 6 months.   .. Lab Results  Component Value Date   HGBA1C 7.9 07/13/2017   Discussed diabetic diet and exercise.declined nutritionist at this point.  Discussed disease and progression.  Start janumet. Discussed side effects. Follow up in 3 months.  At next visit will get pneumonia and flu shot.   Spent 30 minutes and greater than 50 percent of visit spent counseling pt on new dx of diabetes.  Encouraged annual eye exam.

## 2017-10-13 ENCOUNTER — Encounter: Payer: Self-pay | Admitting: Physician Assistant

## 2017-10-13 ENCOUNTER — Ambulatory Visit (INDEPENDENT_AMBULATORY_CARE_PROVIDER_SITE_OTHER): Payer: Managed Care, Other (non HMO) | Admitting: Physician Assistant

## 2017-10-13 VITALS — BP 110/53 | HR 80 | Ht 60.0 in | Wt 170.0 lb

## 2017-10-13 DIAGNOSIS — Z23 Encounter for immunization: Secondary | ICD-10-CM | POA: Diagnosis not present

## 2017-10-13 DIAGNOSIS — E119 Type 2 diabetes mellitus without complications: Secondary | ICD-10-CM | POA: Diagnosis not present

## 2017-10-13 DIAGNOSIS — R739 Hyperglycemia, unspecified: Secondary | ICD-10-CM

## 2017-10-13 DIAGNOSIS — E782 Mixed hyperlipidemia: Secondary | ICD-10-CM | POA: Diagnosis not present

## 2017-10-13 DIAGNOSIS — Z79899 Other long term (current) drug therapy: Secondary | ICD-10-CM | POA: Diagnosis not present

## 2017-10-13 LAB — POCT GLYCOSYLATED HEMOGLOBIN (HGB A1C): HEMOGLOBIN A1C: 6.9

## 2017-10-13 MED ORDER — ERTUGLIFLOZIN L-PYROGLUTAMICAC 5 MG PO TABS: 1.0000 | ORAL_TABLET | Freq: Every day | ORAL | 2 refills | Status: DC

## 2017-10-13 NOTE — Progress Notes (Signed)
   Subjective:    Patient ID: Connie Dyer, female    DOB: October 05, 1966, 51 y.o.   MRN: [id number]  HPI Pt is a 51 yo female who presents to clinic for 3 month follow up on DM.   She admits she never started janumet because the pill was too big. She also admits she has been in denial of DM until the last few weeks. She did start checking sugars in last few weeks and fasting has been 120 to 160. She has a wellness program at work that she wants to get involved with. She has lost 9lbs since last visit. No open sores or wounds. No hypoglycemia.   .. Active Ambulatory Problems    Diagnosis Date Noted  . Essential hypertension 04/03/2015  . PTSD (post-traumatic stress disorder) 04/03/2015  . History of colonic polyps 04/04/2015  . Hyperlipidemia 05/10/2015  . Hyperglycemia 05/10/2015  . 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 Ankle fracture, left 12/03/2015  . Diabetes mellitus without complication (HCC) 07/13/2017   Resolved Ambulatory Problems    Diagnosis Date Noted  . Type 2 diabetes mellitus (HCC) 07/24/2015   No Additional Past Medical History       Review of Systems  All other systems reviewed and are negative.      Objective:   Physical Exam  Constitutional: She is oriented to person, place, and time. She appears well-developed and well-nourished.  HENT:  Head: Normocephalic and atraumatic.  Cardiovascular: Normal rate, regular rhythm and normal heart sounds.  Pulmonary/Chest: Effort normal and breath sounds normal.  Neurological: She is alert and oriented to person, place, and time.  Psychiatric: She has a normal mood and affect. Her behavior is normal.          Assessment & Plan:  Marland Kitchen.Marland Kitchen.Connie Dyer was seen today for diabetes.  Diagnoses and all orders for this visit:  Diabetes mellitus without complication (HCC) -     POCT HgB A1C -     Ertugliflozin L-PyroglutamicAc (STEGLATRO) 5 MG TABS; Take 1 tablet daily by mouth.  Influenza vaccine needed -     Flu  Vaccine QUAD 6+ mos PF IM (Fluarix Quad PF)  Hyperglycemia  Mixed hyperlipidemia -     Lipid Panel w/reflex Direct LDL  Medication management -     COMPLETE METABOLIC PANEL WITH GFR      ... Lab Results  Component Value Date   HGBA1C 6.9 10/13/2017   A!C actually improved! Way to go.  Continue to work on DM diet with exercise.  On ACE.  BP controlled.  Ordered lipid to check to see if at cholesterol goal.  Encouraged ASA daily.  Added steglatro to get better DM control and perhaps lose more weight. Discussed side effects. Coupon card given.  Flu shot given.  Declines pneumonia vaccine today.  Needs eye exam.  Declined referral to nutritionist but may consider if work does not have this.

## 2017-10-13 NOTE — Patient Instructions (Signed)
Ertugliflozin; Sitagliptin tablets What is this medicine? ERTUGLIFLOZIN; SITAGLIPTIN (er TOO gli FLOE zin; sit a GLIP tin) is a combination of 2 medicines used to treat type 2 diabetes. This medicine lowers blood sugar. Treatment is combined with a balanced diet and exercise. This medicine may be used for other purposes; ask your health care provider or pharmacist if you have questions. COMMON BRAND NAME(S): Steglujan What should I tell my health care provider before I take this medicine? They need to know if you have any of these conditions: -artery disease -dehydration -diabetic ketoacidosis -diet low in salt -eating less due to illness, surgery, dieting, or any other reason -foot sores -having surgery -heart disease -high cholesterol -history of amputation -history of pancreatitis or pancreas problems -history of yeast infection of the penis or vagina -if you often drink alcohol -infections in the bladder, kidneys, or urinary tract -kidney disease -liver disease -low blood pressure -nerve damage -on hemodialysis -previous swelling of the tongue, face, or lips with difficulty breathing, difficulty swallowing, hoarseness, or tightening of the throat -problems urinating -type 1 diabetes -uncircumcised female -an unusual or allergic reaction to ertugliflozin, sitagliptin, other medicines, foods, dyes, or preservatives -pregnant or trying to get pregnant -breast-feeding How should I use this medicine? Take this medicine by mouth with a glass of water. Follow the directions on the prescription label. You can take it with or without food. If it upsets your stomach, take it with food. Take this medicine in the morning. Take your dose at the same time each day. Do not take more often than directed. Do not stop taking except on your doctor's advice. A special MedGuide will be given to you by the pharmacist with each prescription and refill. Be sure to read this information carefully each  time. Talk to your pediatrician regarding the use of this medicine in children. Special care may be needed. Overdosage: If you think you have taken too much of this medicine contact a poison control center or emergency room at once. NOTE: This medicine is only for you. Do not share this medicine with others. What if I miss a dose? If you miss a dose, take it as soon as you can. If it is almost time for your next dose, take only that dose. Do not take double or extra doses. What may interact with this medicine? -alcohol -certain medicines for blood pressure, heart disease -digoxin -diuretics -insulin -nateglinide -quinolone antibiotics like ciprofloxacin, levofloxacin, ofloxacin -repaglinide -some herbal dietary supplements -steroid medicines like prednisone or cortisone -sulfonylureas like glimepiride, glipizide, glyburide -thyroid medicine This list may not describe all possible interactions. Give your health care provider a list of all the medicines, herbs, non-prescription drugs, or dietary supplements you use. Also tell them if you smoke, drink alcohol, or use illegal drugs. Some items may interact with your medicine. What should I watch for while using this medicine? Visit your doctor or health care professional for regular checks on your progress. This medicine can cause a serious condition in which there is too much acid in the blood. If you develop nausea, vomiting, stomach pain, unusual tiredness, or breathing problems, stop taking this medicine and call your doctor right away. If possible, use a ketone dipstick to check for ketones in your urine. A test called the HbA1C (A1C) will be monitored. This is a simple blood test. It measures your blood sugar control over the last 2 to 3 months. You will receive this test every 3 to 6 months. Learn how to check  your blood sugar. Learn the symptoms of low and high blood sugar and how to manage them. Always carry a quick-source of sugar with  you in case you have symptoms of low blood sugar. Examples include hard sugar candy or glucose tablets. Make sure others know that you can choke if you eat or drink when you develop serious symptoms of low blood sugar, such as seizures or unconsciousness. They must get medical help at once. Tell your doctor or health care professional if you have high blood sugar. You might need to change the dose of your medicine. If you are sick or exercising more than usual, you might need to change the dose of your medicine. Do not skip meals. Ask your doctor or health care professional if you should avoid alcohol. Many nonprescription cough and cold products contain sugar or alcohol. These can affect blood sugar. Wear a medical ID bracelet or chain, and carry a card that describes your disease and details of your medicine and dosage times. What side effects may I notice from receiving this medicine? Side effects that you should report to your doctor or health care professional as soon as possible: -allergic reactions like skin rash, itching or hives, swelling of the face, lips, or tongue -breathing problems -dizziness -feeling faint or lightheaded, falls -joint pain -muscle weakness -penile discharge, itching, or pain in men -redness, blistering, peeling or loosening of the skin, including inside the mouth -signs and symptoms of low blood sugar such as feeling anxious, confusion, dizziness, increased hunger, unusually weak or tired, sweating, shakiness, cold, irritable, headache, blurred vision, fast heartbeat, loss of consciousness -signs and symptoms of a urinary tract infection, such as fever, chills, a burning feeling when urinating, blood in the urine, back pain -swelling of the hands, legs, and/or feet -trouble passing urine or change in the amount of urine, including an urgent need to urinate more often, in larger amounts, or at night -unusual stomach pain or discomfort -unusual tiredness -vaginal  discharge, itching, or odor in women -vomiting Side effects that usually do not require medical attention (report these to your doctor or health care professional if they continue or are bothersome): -diarrhea -headache -mild increase in urination -sore throat -stomach upset -stuffy or runny nose -thirsty This list may not describe all possible side effects. Call your doctor for medical advice about side effects. You may report side effects to FDA at 1-800-FDA-1088. Where should I keep my medicine? Keep out of the reach of children. Store at room temperature between 15 and 30 degrees C (59 and 86 degrees F). Throw away any unused medicine after the expiration date. NOTE: This sheet is a summary. It may not cover all possible information. If you have questions about this medicine, talk to your doctor, pharmacist, or health care provider.  2018 Elsevier/Gold Standard (2016-12-10 13:40:29)

## 2017-10-14 ENCOUNTER — Encounter: Payer: Self-pay | Admitting: Physician Assistant

## 2017-11-24 ENCOUNTER — Telehealth: Payer: Self-pay | Admitting: *Deleted

## 2017-11-24 NOTE — Telephone Encounter (Signed)
[id number].

## 2017-11-25 NOTE — Telephone Encounter (Signed)
steglatro has been denied. Letter in [employer] box

## 2017-12-03 ENCOUNTER — Other Ambulatory Visit: Payer: Self-pay | Admitting: *Deleted

## 2017-12-03 MED ORDER — DAPAGLIFLOZIN PROPANEDIOL 10 MG PO TABS: 10.0000 mg | ORAL_TABLET | Freq: Every day | ORAL | 2 refills | Status: DC

## 2017-12-16 LAB — COMPLETE METABOLIC PANEL WITH GFR
AG Ratio: 1.7 (calc) (ref 1.0–2.5)
ALBUMIN MSPROF: 4.4 g/dL (ref 3.6–5.1)
ALT: 15 U/L (ref 6–29)
AST: 13 U/L (ref 10–35)
Alkaline phosphatase (APISO): 18 U/L — ABNORMAL LOW (ref 33–130)
BUN: 17 mg/dL (ref 7–25)
CALCIUM: 9.6 mg/dL (ref 8.6–10.4)
CO2: 29 mmol/L (ref 20–32)
CREATININE: 0.7 mg/dL (ref 0.50–1.05)
Chloride: 99 mmol/L (ref 98–110)
GFR, EST AFRICAN AMERICAN: 115 mL/min/{1.73_m2} (ref >=60)
GFR, EST NON AFRICAN AMERICAN: 99 mL/min/{1.73_m2} (ref >=60)
GLOBULIN: 2.6 g/dL (ref 1.9–3.7)
Glucose, Bld: 125 mg/dL — ABNORMAL HIGH (ref 65–99)
POTASSIUM: 4.1 mmol/L (ref 3.5–5.3)
SODIUM: 135 mmol/L (ref 135–146)
TOTAL PROTEIN: 7 g/dL (ref 6.1–8.1)
Total Bilirubin: 0.7 mg/dL (ref 0.2–1.2)

## 2017-12-16 LAB — LIPID PANEL W/REFLEX DIRECT LDL
CHOL/HDL RATIO: 4.3 (calc) (ref <5.0)
CHOLESTEROL: 200 mg/dL — AB (ref <200)
HDL: 46 mg/dL — AB (ref >50)
LDL Cholesterol (Calc): 131 mg/dL (calc) — ABNORMAL HIGH
Non-HDL Cholesterol (Calc): 154 mg/dL (calc) — ABNORMAL HIGH (ref <130)
Triglycerides: 120 mg/dL (ref <150)

## 2018-01-09 ENCOUNTER — Other Ambulatory Visit: Payer: Self-pay | Admitting: Physician Assistant

## 2018-01-09 DIAGNOSIS — I10 Essential (primary) hypertension: Secondary | ICD-10-CM

## 2018-01-09 DIAGNOSIS — F4310 Post-traumatic stress disorder, unspecified: Secondary | ICD-10-CM

## 2018-01-13 ENCOUNTER — Ambulatory Visit: Payer: Managed Care, Other (non HMO) | Admitting: Physician Assistant

## 2018-01-14 ENCOUNTER — Ambulatory Visit: Payer: Managed Care, Other (non HMO) | Admitting: Physician Assistant

## 2018-01-30 ENCOUNTER — Other Ambulatory Visit: Payer: Self-pay | Admitting: Physician Assistant

## 2018-01-30 DIAGNOSIS — F4310 Post-traumatic stress disorder, unspecified: Secondary | ICD-10-CM

## 2018-01-30 DIAGNOSIS — I10 Essential (primary) hypertension: Secondary | ICD-10-CM

## 2018-02-03 ENCOUNTER — Encounter: Payer: Self-pay | Admitting: Physician Assistant

## 2018-02-03 DIAGNOSIS — I10 Essential (primary) hypertension: Secondary | ICD-10-CM

## 2018-02-04 MED ORDER — LISINOPRIL 10 MG PO TABS: 10.0000 mg | ORAL_TABLET | Freq: Every day | ORAL | 0 refills | Status: DC

## 2018-02-11 ENCOUNTER — Ambulatory Visit: Payer: 59 | Admitting: Sports Medicine

## 2018-02-28 ENCOUNTER — Other Ambulatory Visit: Payer: Self-pay | Admitting: Physician Assistant

## 2018-02-28 DIAGNOSIS — I10 Essential (primary) hypertension: Secondary | ICD-10-CM

## 2018-02-28 DIAGNOSIS — F4310 Post-traumatic stress disorder, unspecified: Secondary | ICD-10-CM

## 2018-03-17 ENCOUNTER — Other Ambulatory Visit: Payer: Self-pay | Admitting: Physician Assistant

## 2018-03-17 DIAGNOSIS — I10 Essential (primary) hypertension: Secondary | ICD-10-CM

## 2018-03-17 DIAGNOSIS — F4310 Post-traumatic stress disorder, unspecified: Secondary | ICD-10-CM

## 2018-04-24 ENCOUNTER — Other Ambulatory Visit: Payer: Self-pay | Admitting: Physician Assistant

## 2018-04-24 DIAGNOSIS — I10 Essential (primary) hypertension: Secondary | ICD-10-CM

## 2018-05-09 ENCOUNTER — Other Ambulatory Visit: Payer: Self-pay | Admitting: Physician Assistant

## 2018-05-09 DIAGNOSIS — F4310 Post-traumatic stress disorder, unspecified: Secondary | ICD-10-CM

## 2018-05-09 DIAGNOSIS — I10 Essential (primary) hypertension: Secondary | ICD-10-CM

## 2018-05-15 ENCOUNTER — Other Ambulatory Visit: Payer: Self-pay | Admitting: Physician Assistant

## 2018-05-15 DIAGNOSIS — I10 Essential (primary) hypertension: Secondary | ICD-10-CM

## 2018-05-16 ENCOUNTER — Other Ambulatory Visit: Payer: Self-pay | Admitting: Physician Assistant

## 2018-05-16 DIAGNOSIS — F4310 Post-traumatic stress disorder, unspecified: Secondary | ICD-10-CM

## 2018-05-16 DIAGNOSIS — I10 Essential (primary) hypertension: Secondary | ICD-10-CM

## 2018-05-16 MED ORDER — LISINOPRIL 10 MG PO TABS: 10.0000 mg | ORAL_TABLET | Freq: Every day | ORAL | 0 refills | Status: DC

## 2018-05-16 MED ORDER — HYDROCHLOROTHIAZIDE 25 MG PO TABS: 25.0000 mg | ORAL_TABLET | Freq: Every day | ORAL | 0 refills | Status: DC

## 2018-05-16 MED ORDER — SERTRALINE HCL 100 MG PO TABS: 100.0000 mg | ORAL_TABLET | Freq: Every day | ORAL | 0 refills | Status: DC

## 2018-05-20 ENCOUNTER — Ambulatory Visit: Payer: 59 | Admitting: Physician Assistant

## 2018-05-24 ENCOUNTER — Encounter: Payer: Self-pay | Admitting: Physician Assistant

## 2018-05-24 ENCOUNTER — Ambulatory Visit (INDEPENDENT_AMBULATORY_CARE_PROVIDER_SITE_OTHER): Payer: 59 | Admitting: Physician Assistant

## 2018-05-24 VITALS — BP 134/90 | HR 79 | Wt 176.0 lb

## 2018-05-24 DIAGNOSIS — M25562 Pain in left knee: Secondary | ICD-10-CM | POA: Insufficient documentation

## 2018-05-24 DIAGNOSIS — Z1231 Encounter for screening mammogram for malignant neoplasm of breast: Secondary | ICD-10-CM

## 2018-05-24 DIAGNOSIS — E119 Type 2 diabetes mellitus without complications: Secondary | ICD-10-CM

## 2018-05-24 DIAGNOSIS — Z13220 Encounter for screening for lipoid disorders: Secondary | ICD-10-CM

## 2018-05-24 DIAGNOSIS — I10 Essential (primary) hypertension: Secondary | ICD-10-CM

## 2018-05-24 DIAGNOSIS — F4310 Post-traumatic stress disorder, unspecified: Secondary | ICD-10-CM | POA: Diagnosis not present

## 2018-05-24 LAB — POCT GLYCOSYLATED HEMOGLOBIN (HGB A1C): HEMOGLOBIN A1C: 6.5 % — AB (ref 4.0–5.6)

## 2018-05-24 MED ORDER — LISINOPRIL 10 MG PO TABS: 10.0000 mg | ORAL_TABLET | Freq: Every day | ORAL | 4 refills | Status: DC

## 2018-05-24 MED ORDER — SERTRALINE HCL 100 MG PO TABS: ORAL_TABLET | ORAL | 4 refills | Status: DC

## 2018-05-24 MED ORDER — HYDROCHLOROTHIAZIDE 25 MG PO TABS
25.0000 mg | ORAL_TABLET | Freq: Every day | ORAL | 4 refills | Status: DC
Start: 2018-05-24 — End: 2019-03-24

## 2018-05-24 NOTE — Progress Notes (Signed)
Subjective:    Patient ID: Connie Dyer, female    DOB: [birth year], 53 y.o.   MRN: [id number]  HPI Patient is a 52 yo female with a pmhx of HTN, DM, and PTSD reporting to clinic for follow up on her medications.  HTN - She states she is doing well on current medications and taking them as directed.   DM - She was prescribed Farxiga last visit but was only able to take it for a month due to lack of insurance coverage. She is not currently on any DM medications. She has never tried metformin. She would like to manage her DM with diet and exercise without medications. She states her diet and exercise are inconsistent but she tries to exercise with stationary bike or swimming. She tries to limit carbs and pay attention to portion size. She does not consume a lot of sweets. Her sugars at home are 130s-140s after meals and 120-125 fasting in the morning. She has not had an exam this year and is waiting on finances. She has not had a foot exam this year.  PTSD - She is overall feeling well and is satisfied with sertraline.  Knee pain - She injured her left knee at a water park over the weekend. She describes the injury as valgus strain.   Review of Systems  All other systems reviewed and are negative.   .. Active Ambulatory Problems    Diagnosis Date Noted  . Essential hypertension 04/03/2015  . PTSD (post-traumatic stress disorder) 04/03/2015  . History of colonic polyps 04/04/2015  . Hyperlipidemia 05/10/2015  . Hyperglycemia 05/10/2015  . 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 Ankle fracture, left 12/03/2015  . Diabetes mellitus without complication (HCC) 07/13/2017   Resolved Ambulatory Problems    Diagnosis Date Noted  . Type 2 diabetes mellitus (HCC) 07/24/2015   No Additional Past Medical History       Objective:   Physical Exam  Constitutional: She is oriented to person, place, and time. She appears well-developed and well-nourished.  HENT:  Head: Normocephalic and  atraumatic.  Cardiovascular: Normal rate, regular rhythm and normal heart sounds.  Pulmonary/Chest: Effort normal and breath sounds normal.  Musculoskeletal:       Left knee: She exhibits no swelling and no deformity. Tenderness found. Lateral joint line tenderness noted.  Neurological: She is alert and oriented to person, place, and time.  Skin: Skin is warm and dry.  Psychiatric: She has a normal mood and affect. Her behavior is normal.    . Vitals:   05/24/18 0832  BP: 136/66  Pulse: 79  SpO2: 99%   Results for orders placed or performed in visit on 05/24/18  POCT HgB A1C  Result Value Ref Range   Hemoglobin A1C 6.5 (A) 4.0 - 5.6 %   HbA1c POC (<> result, manual entry)  4.0 - 5.6 %   HbA1c, POC (prediabetic range)  5.7 - 6.4 %   HbA1c, POC (controlled diabetic range)  0.0 - 7.0 %    .Marland Kitchen Diabetic Foot Exam - Simple   Simple Foot Form Visual Inspection No deformities, no ulcerations, no other skin breakdown bilaterally:  Yes Sensation Testing Intact to touch and monofilament testing bilaterally:  Yes Pulse Check Posterior Tibialis and Dorsalis pulse intact bilaterally:  Yes Comments        Assessment & Plan:  Marland KitchenMarland KitchenTresa was seen today for hypertension and diabetes.  Diagnoses and all orders for this visit:  Diabetes mellitus without complication (HCC) -  POCT HgB A1C -     COMPLETE METABOLIC PANEL WITH GFR  Essential hypertension -     hydrochlorothiazide (HYDRODIURIL) 25 MG tablet; Take 1 tablet (25 mg total) by mouth daily. -     lisinopril (PRINIVIL,ZESTRIL) 10 MG tablet; Take 1 tablet (10 mg total) by mouth daily. -     COMPLETE METABOLIC PANEL WITH GFR  PTSD (post-traumatic stress disorder) -     sertraline (ZOLOFT) 100 MG tablet; TAKE 1 TABLET DAILY.  Visit for screening mammogram -     MM 3D SCREEN BREAST BILATERAL  Screening for lipid disorders -     Lipid Panel w/reflex Direct LDL   .Marland Kitchen. [facility] screen [facility] 05/24/2018 07/13/2017   Decreased Interest 0 0  Down, Depressed, Hopeless 0 0  PHQ - 2 Score 0 0  Altered sleeping 0 -  Tired, decreased energy 1 -  Change in appetite 0 -  Feeling bad or failure about yourself  0 -  Trouble concentrating 0 -  Moving slowly or fidgety/restless 0 -  Suicidal thoughts 0 -  PHQ-9 Score 1 -  Difficult doing work/chores Not difficult at all -   .. GAD 7 : Generalized Anxiety Score 05/24/2018  Nervous, Anxious, on Edge 1  Control/stop worrying 0  Worry too much - different things 0  Trouble relaxing 0  Restless 0  Easily annoyed or irritable 1  Afraid - awful might happen 0  Total GAD 7 Score 2  Anxiety Difficulty Not difficult at all   .Marland Kitchen. Results for orders placed or performed in visit on 05/24/18  POCT HgB A1C  Result Value Ref Range   Hemoglobin A1C 6.5 (A) 4.0 - 5.6 %   HbA1c POC (<> result, manual entry)  4.0 - 5.6 %   HbA1c, POC (prediabetic range)  5.7 - 6.4 %   HbA1c, POC (controlled diabetic range)  0.0 - 7.0 %    HTN - Continue current regimen. Discussed goal for diabetic patient as less than 130/90. Encouraged checking BP at home.  DM - Encouraged continued exercise and healthy diet. Discussed that there are diabetes and weight loss medications that could benefit her if she reconsiders medications in the future. Discussed need for eye exam.  On ACE.  Checking lipid level.   PTSD - GAD and PHQ9 scores look good today. Continue sertraline. Refills sent.  Left Knee injury - Discussed ice and NSAIDs for pain and inflammation as well as sleeve brace as she stands a lot for work. She will call or return if pain worsens.  Screening labs. Declines shingles vaccine.  Follow up in 6 months.

## 2018-05-24 NOTE — Patient Instructions (Signed)

## 2018-06-22 LAB — HM DIABETES EYE EXAM

## 2018-08-05 ENCOUNTER — Encounter: Payer: Self-pay | Admitting: Physician Assistant

## 2018-08-05 ENCOUNTER — Other Ambulatory Visit: Payer: Self-pay | Admitting: Physician Assistant

## 2018-08-05 DIAGNOSIS — I10 Essential (primary) hypertension: Secondary | ICD-10-CM

## 2018-12-09 ENCOUNTER — Encounter: Payer: Self-pay | Admitting: Family Medicine

## 2018-12-09 ENCOUNTER — Ambulatory Visit (INDEPENDENT_AMBULATORY_CARE_PROVIDER_SITE_OTHER): Payer: 59 | Admitting: Family Medicine

## 2018-12-09 VITALS — BP 122/66 | HR 84 | Temp 98.4°F | Ht 60.0 in | Wt 174.0 lb

## 2018-12-09 DIAGNOSIS — J40 Bronchitis, not specified as acute or chronic: Secondary | ICD-10-CM | POA: Diagnosis not present

## 2018-12-09 MED ORDER — BENZONATATE 200 MG PO CAPS: 200.0000 mg | ORAL_CAPSULE | Freq: Three times a day (TID) | ORAL | 0 refills | Status: DC | PRN

## 2018-12-09 MED ORDER — ALBUTEROL SULFATE HFA 108 (90 BASE) MCG/ACT IN AERS: 2.0000 | INHALATION_SPRAY | Freq: Four times a day (QID) | RESPIRATORY_TRACT | 0 refills | Status: DC | PRN

## 2018-12-09 MED ORDER — PREDNISONE 10 MG PO TABS: 30.0000 mg | ORAL_TABLET | Freq: Every day | ORAL | 0 refills | Status: DC

## 2018-12-09 NOTE — Progress Notes (Signed)
Connie Dyer is a 53 y.o. female who presents to [facility] D[facility]: [facility] today for cough and chest congestion.   Connie Dyer has been sick for about a week with runny nose and sinus congestion.  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  However about 2 days ago she developed some chest congestion and cough with a little bit of wheezing.  She notes the cough is not very productive.  She denies significant fevers chills or or shortness of breath.  She denies significant body ache.  She is tried some chloracetic which helps a bit.  She is not tried much other medications.  She wants to make sure that she is now developing a more serious infection.   ROS as above:  Exam:  BP 122/66   Pulse 84   Temp 98.4 F (36.9 C) (Oral)   Ht 5' (1.524 m)   Wt 174 lb (78.9 kg)   SpO2 100%   BMI 33.98 kg/m  Wt Readings from Last 5 Encounters:  12/09/18 174 lb (78.9 kg)  05/24/18 176 lb (79.8 kg)  10/13/17 170 lb (77.1 kg)  07/13/17 179 lb (81.2 kg)  10/19/16 175 lb (79.4 kg)    Gen: Well NAD HEENT: EOMI,  MMM mild clear nasal discharge.  Slightly inflamed nasal turbinates.  Normal posterior pharynx.  Mild cervical lymphadenopathy.  Normal tympanic membranes. Lungs: Normal work of breathing. CTABL Heart: RRR no MRG Abd: NABS, Soft. Nondistended, Nontender Exts: Brisk capillary refill, warm and well perfused.   Lab and Radiology Results No results found for this or any previous visit (from the past 72 hour(s)). No results found.    Assessment and Plan: 53 y.o. female with  Viral URI with likely bronchitis.  Plan for symptomatic management with maximizing over-the-counter medications including acetaminophen and Mucinex DM.  Additionally will use Tessalon Perles and trial of an albuterol inhaler.  If  not improved I have printed a backup prednisone prescription which patient will fill if not improving or worsening.  Precautions reviewed.  PDMP not reviewed this encounter. No orders of the defined types were placed in this encounter.  Meds ordered this encounter  Medications  . albuterol (PROVENTIL HFA;VENTOLIN HFA) 108 (90 Base) MCG/ACT inhaler    Sig: Inhale 2 puffs into the lungs every 6 (six) hours as needed for wheezing or shortness of breath.    Dispense:  1 Inhaler    Refill:  0  . predniSONE (DELTASONE) 10 MG tablet    Sig: Take 3 tablets (30 mg total) by mouth daily with breakfast.    Dispense:  15 tablet    Refill:  0  . benzonatate (TESSALON) 200 MG capsule    Sig: Take 1 capsule (200 mg total) by mouth 3 (three) times daily as needed for cough.    Dispense:  45 capsule    Refill:  0     Historical information moved to improve visibility of documentation.  Past Medical History:  Diagnosis Date  . Ankle fracture, left 12/03/2015  . Diabetes mellitus without complication (HCC) 07/13/2017  . Essential hypertension 04/03/2015  . 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 History of colonic polyps 04/04/2015   2015: benign adenoma and sessile serrated polyp.   Marland Kitchen Hyperlipidemia 05/10/2015   AHA 10 year risk of 2.3% June  2016   . PTSD (post-traumatic stress disorder) 04/03/2015   Past Surgical History:  Procedure Laterality Date  . CHOLECYSTECTOMY     Social History   Tobacco Use  . Smoking status: Never Smoker  . Smokeless tobacco: Never Used  Substance Use Topics  . Alcohol use: Not on file   family history is not on file.  Medications: Current Outpatient Medications  Medication Sig Dispense Refill  . hydrochlorothiazide (HYDRODIURIL) 25 MG tablet Take 1 tablet (25 mg total) by mouth daily. 90 tablet 4  . lisinopril (PRINIVIL,ZESTRIL) 10 MG tablet Take 1 tablet (10 mg total) by mouth daily. 90 tablet 4  . lisinopril (PRINIVIL,ZESTRIL) 10 MG  tablet TAKE 1 TABLET DAILY 90 tablet 1  . sertraline (ZOLOFT) 100 MG tablet TAKE 1 TABLET DAILY. 90 tablet 4  . albuterol (PROVENTIL HFA;VENTOLIN HFA) 108 (90 Base) MCG/ACT inhaler Inhale 2 puffs into the lungs every 6 (six) hours as needed for wheezing or shortness of breath. 1 Inhaler 0  . benzonatate (TESSALON) 200 MG capsule Take 1 capsule (200 mg total) by mouth 3 (three) times daily as needed for cough. 45 capsule 0  . predniSONE (DELTASONE) 10 MG tablet Take 3 tablets (30 mg total) by mouth daily with breakfast. 15 tablet 0   No current facility-administered medications for this visit.    No Known Allergies   Discussed warning signs or symptoms. Please see discharge instructions. Patient expresses understanding.

## 2018-12-09 NOTE — Patient Instructions (Signed)
Thank you for coming in today. Take over the counter medicine.  Max dose of tylenol is 1000mg  every 6 hours for pain and fever and body aches.  Take muccinex DM twice daily.  Use the prescription tessalon pearles for cough as needed.  Try the inhailler as needed for cough, shortness of breath or tightness.  If not better fill and take printed prednisone.   Call or go to the emergency room if you get worse, have trouble breathing, have chest pains, or palpitations.    Acute Bronchitis, Adult Acute bronchitis is when air tubes (bronchi) in the lungs suddenly get swollen. The condition can make it hard to breathe. It can also cause these symptoms:  A cough.  Coughing up clear, yellow, or green mucus.  Wheezing.  Chest congestion.  Shortness of breath.  A fever.  Body aches.  Chills.  A sore throat. Follow these instructions at home:  Medicines  Take over-the-counter and prescription medicines only as told by your doctor.  If you were prescribed an antibiotic medicine, take it as told by your doctor. Do not stop taking the antibiotic even if you start to feel better. General instructions  Rest.  Drink enough fluids to keep your pee (urine) pale yellow.  Avoid smoking and secondhand smoke. If you smoke and you need help quitting, ask your doctor. Quitting will help your lungs heal faster.  Use an inhaler, cool mist vaporizer, or humidifier as told by your doctor.  Keep all follow-up visits as told by your doctor. This is important. How is this prevented? To lower your risk of getting this condition again:  Wash your hands often with soap and water. If you cannot use soap and water, use hand sanitizer.  Avoid contact with people who have cold symptoms.  Try not to touch your hands to your mouth, nose, or eyes.  Make sure to get the flu shot every year. Contact a doctor if:  Your symptoms do not get better in 2 weeks. Get help right away if:  You cough up  blood.  You have chest pain.  You have very bad shortness of breath.  You become dehydrated.  You faint (pass out) or keep feeling like you are going to pass out.  You keep throwing up (vomiting).  You have a very bad headache.  Your fever or chills gets worse. This information is not intended to replace advice given to you by your health care provider. Make sure you discuss any questions you have with your health care provider. Document Released: 05/04/2008 Document Revised: 06/30/2017 Document Reviewed: 05/06/2016 Elsevier Interactive Patient Education  2019 [employer]

## 2019-01-29 ENCOUNTER — Other Ambulatory Visit: Payer: Self-pay | Admitting: Physician Assistant

## 2019-01-29 DIAGNOSIS — F4310 Post-traumatic stress disorder, unspecified: Secondary | ICD-10-CM

## 2019-03-14 ENCOUNTER — Telehealth: Payer: Self-pay | Admitting: Neurology

## 2019-03-14 NOTE — Telephone Encounter (Signed)
LMOM for patient to call back. She is overdue for visit and labs for DM management. She needs to schedule virtual visit. Awaiting call back from patient.

## 2019-03-23 ENCOUNTER — Other Ambulatory Visit: Payer: Self-pay | Admitting: Physician Assistant

## 2019-03-23 DIAGNOSIS — I10 Essential (primary) hypertension: Secondary | ICD-10-CM

## 2019-03-23 NOTE — Telephone Encounter (Signed)
Received RF request from pharmacy on Lisinopril and HCTZ.  Called and was able to get pt scheduled for in-office appt for med refills and DM check tomorrow, 03/24/19

## 2019-03-24 ENCOUNTER — Ambulatory Visit (INDEPENDENT_AMBULATORY_CARE_PROVIDER_SITE_OTHER): Payer: 59 | Admitting: Physician Assistant

## 2019-03-24 ENCOUNTER — Encounter: Payer: Self-pay | Admitting: Physician Assistant

## 2019-03-24 VITALS — BP 122/74 | HR 82 | Temp 97.5°F | Ht 60.0 in | Wt 171.0 lb

## 2019-03-24 DIAGNOSIS — I10 Essential (primary) hypertension: Secondary | ICD-10-CM

## 2019-03-24 DIAGNOSIS — E119 Type 2 diabetes mellitus without complications: Secondary | ICD-10-CM

## 2019-03-24 DIAGNOSIS — F4310 Post-traumatic stress disorder, unspecified: Secondary | ICD-10-CM | POA: Diagnosis not present

## 2019-03-24 DIAGNOSIS — E782 Mixed hyperlipidemia: Secondary | ICD-10-CM

## 2019-03-24 LAB — POCT GLYCOSYLATED HEMOGLOBIN (HGB A1C): Hemoglobin A1C: 7.3 % — AB (ref 4.0–5.6)

## 2019-03-24 MED ORDER — SERTRALINE HCL 100 MG PO TABS: ORAL_TABLET | ORAL | 4 refills | Status: DC

## 2019-03-24 MED ORDER — HYDROCHLOROTHIAZIDE 25 MG PO TABS: 25.0000 mg | ORAL_TABLET | Freq: Every day | ORAL | 4 refills | Status: DC

## 2019-03-24 MED ORDER — LISINOPRIL 10 MG PO TABS: 10.0000 mg | ORAL_TABLET | Freq: Every day | ORAL | 4 refills | Status: DC

## 2019-03-24 NOTE — Progress Notes (Signed)
Subjective:    Patient ID: Connie Dyer, female    DOB: November 11, 1966, 53 y.o.   MRN: [id number]  HPI Pt is a 53 yo female with T2DM, HTN, PTSD, HLD who presents to the clinic for follow up.   HTN- no problems or concerns. Pt denied any CP, palpitations, headaches, or vision changes. Taking lisinopril daily.   Pt is not taking nor ever taken anything for diabetes. She comes in for recheck. No vision changes. No hypoglycemic events. Not checking sugars.   Pts mood is doing great. No problems or concerns.   .. Active Ambulatory Problems    Diagnosis Date Noted  . Essential hypertension 04/03/2015  . PTSD (post-traumatic stress disorder) 04/03/2015  . History of colonic polyps 04/04/2015  . Hyperlipidemia 05/10/2015  . 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 Diabetes mellitus without complication (HCC) 07/13/2017  . Acute pain of left knee 05/24/2018   Resolved Ambulatory Problems    Diagnosis Date Noted  . Hyperglycemia 05/10/2015  . Type 2 diabetes mellitus (HCC) 07/24/2015  . Ankle fracture, left 12/03/2015   No Additional Past Medical History     Review of Systems  All other systems reviewed and are negative.      Objective:   Physical Exam Vitals signs reviewed.  Constitutional:      Appearance: Normal appearance. She is obese.  HENT:     Head: Normocephalic.  Neck:     Vascular: No carotid bruit.  Cardiovascular:     Rate and Rhythm: Normal rate and regular rhythm.     Heart sounds: No murmur.  Pulmonary:     Effort: Pulmonary effort is normal.     Breath sounds: Normal breath sounds.  Neurological:     General: No focal deficit present.     Mental Status: She is alert and oriented to person, place, and time.  Psychiatric:        Mood and Affect: Mood normal.        Behavior: Behavior normal.           Assessment & Plan:  Marland Kitchen.Marland Kitchen.Elease Hashimotoatricia was seen today for diabetes.  Diagnoses and all orders for this visit:  Diabetes mellitus without complication  (HCC) -     POCT glycosylated hemoglobin (Hb A1C) -     Lipid Panel w/reflex Direct LDL -     COMPLETE METABOLIC PANEL WITH GFR  Essential hypertension -     COMPLETE METABOLIC PANEL WITH GFR -     hydrochlorothiazide (HYDRODIURIL) 25 MG tablet; Take 1 tablet (25 mg total) by mouth daily. -     lisinopril (ZESTRIL) 10 MG tablet; Take 1 tablet (10 mg total) by mouth daily.  PTSD (post-traumatic stress disorder) -     sertraline (ZOLOFT) 100 MG tablet; TAKE 1 TABLET DAILY.  Mixed hyperlipidemia -     COMPLETE METABOLIC PANEL WITH GFR   .Marland Kitchen. [facility] screen [facility] 03/24/2019 05/24/2018 07/13/2017  Decreased Interest 0 0 0  Down, Depressed, Hopeless 1 0 0  PHQ - 2 Score 1 0 0  Altered sleeping 0 0 -  Tired, decreased energy 0 1 -  Change in appetite 0 0 -  Feeling bad or failure about yourself  0 0 -  Trouble concentrating 0 0 -  Moving slowly or fidgety/restless 0 0 -  Suicidal thoughts 0 0 -  PHQ-9 Score 1 1 -  Difficult doing work/chores Not difficult at all Not difficult at all -   .. GAD 7 : Generalized  Anxiety Score 03/24/2019 05/24/2018  Nervous, Anxious, on Edge 1 1  Control/stop worrying 0 0  Worry too much - different things 0 0  Trouble relaxing 0 0  Restless 0 0  Easily annoyed or irritable 1 1  Afraid - awful might happen 0 0  Total GAD 7 Score 2 2  Anxiety Difficulty Not difficult at all Not difficult at all    Refilled medications.   .. Results for orders placed or performed in visit on 03/24/19  POCT glycosylated hemoglobin (Hb A1C)  Result Value Ref Range   Hemoglobin A1C 7.3 (A) 4.0 - 5.6 %   HbA1c POC (<> result, manual entry)     HbA1c, POC (prediabetic range)     HbA1c, POC (controlled diabetic range)     Long discussion about DM and risk and need for control.  HO given for DM nutrition.  Discussed options to start xigduo and/or trulicity/ozempic. Discussed side effects. Will call back with choice.  Ordered lipid panel discussed starting statin.  Pt agreed to consider.  On ACe. BP controlled.  Declined pneumonia vaccine.   Marland Kitchen.Spent 30 minutes with patient and greater than 50 percent of visit spent counseling patient regarding treatment plan.

## 2019-03-24 NOTE — Patient Instructions (Addendum)
Combination pill would be xigduo.  Once weekly injection would be ozempic and trulicity.    Diabetes Mellitus and Nutrition, Adult When you have diabetes (diabetes mellitus), it is very important to have healthy eating habits because your blood sugar (glucose) levels are greatly affected by what you eat and drink. Eating healthy foods in the appropriate amounts, at about the same times every day, can help you:  Control your blood glucose.  Lower your risk of heart disease.  Improve your blood pressure.  Reach or maintain a healthy weight. Every person with diabetes is different, and each person has different needs for a meal plan. Your health care provider may recommend that you work with a diet and nutrition specialist (dietitian) to make a meal plan that is best for you. Your meal plan may vary depending on factors such as:  The calories you need.  The medicines you take.  Your weight.  Your blood glucose, blood pressure, and cholesterol levels.  Your activity level.  Other health conditions you have, such as heart or kidney disease. How do carbohydrates affect me? Carbohydrates, also called carbs, affect your blood glucose level more than any other type of food. Eating carbs naturally raises the amount of glucose in your blood. Carb counting is a method for keeping track of how many carbs you eat. Counting carbs is important to keep your blood glucose at a healthy level, especially if you use insulin or take certain oral diabetes medicines. It is important to know how many carbs you can safely have in each meal. This is different for every person. Your dietitian can help you calculate how many carbs you should have at each meal and for each snack. Foods that contain carbs include:  Bread, cereal, rice, pasta, and crackers.  Potatoes and corn.  Peas, beans, and lentils.  Milk and yogurt.  Fruit and juice.  Desserts, such as cakes, cookies, ice cream, and candy. How does  alcohol affect me? Alcohol can cause a sudden decrease in blood glucose (hypoglycemia), especially if you use insulin or take certain oral diabetes medicines. Hypoglycemia can be a life-threatening condition. Symptoms of hypoglycemia (sleepiness, dizziness, and confusion) are similar to symptoms of having too much alcohol. If your health care provider says that alcohol is safe for you, follow these guidelines:  Limit alcohol intake to no more than 1 drink per day for nonpregnant women and 2 drinks per day for men. One drink equals 12 oz of beer, 5 oz of wine, or 1 oz of hard liquor.  Do not drink on an empty stomach.  Keep yourself hydrated with water, diet soda, or unsweetened iced tea.  Keep in mind that regular soda, juice, and other mixers may contain a lot of sugar and must be counted as carbs. What are tips for following this plan?  Reading food labels  Start by checking the serving size on the "Nutrition Facts" label of packaged foods and drinks. The amount of calories, carbs, fats, and other nutrients listed on the label is based on one serving of the item. Many items contain more than one serving per package.  Check the total grams (g) of carbs in one serving. You can calculate the number of servings of carbs in one serving by dividing the total carbs by 15. For example, if a food has 30 g of total carbs, it would be equal to 2 servings of carbs.  Check the number of grams (g) of saturated and trans fats in one  serving. Choose foods that have low or no amount of these fats.  Check the number of milligrams (mg) of salt (sodium) in one serving. Most people should limit total sodium intake to less than 2,300 mg per day.  Always check the nutrition information of foods labeled as "low-fat" or "nonfat". These foods may be higher in added sugar or refined carbs and should be avoided.  Talk to your dietitian to identify your daily goals for nutrients listed on the  label. Shopping  Avoid buying canned, premade, or processed foods. These foods tend to be high in fat, sodium, and added sugar.  Shop around the outside edge of the grocery store. This includes fresh fruits and vegetables, bulk grains, fresh meats, and fresh dairy. Cooking  Use low-heat cooking methods, such as baking, instead of high-heat cooking methods like deep frying.  Cook using healthy oils, such as olive, canola, or sunflower oil.  Avoid cooking with butter, cream, or high-fat meats. Meal planning  Eat meals and snacks regularly, preferably at the same times every day. Avoid going long periods of time without eating.  Eat foods high in fiber, such as fresh fruits, vegetables, beans, and whole grains. Talk to your dietitian about how many servings of carbs you can eat at each meal.  Eat 4-6 ounces (oz) of lean protein each day, such as lean meat, chicken, fish, eggs, or tofu. One oz of lean protein is equal to: ? 1 oz of meat, chicken, or fish. ? 1 egg. ?  cup of tofu.  Eat some foods each day that contain healthy fats, such as avocado, nuts, seeds, and fish. Lifestyle  Check your blood glucose regularly.  Exercise regularly as told by your health care provider. This may include: ? 150 minutes of moderate-intensity or vigorous-intensity exercise each week. This could be brisk walking, biking, or water aerobics. ? Stretching and doing strength exercises, such as yoga or weightlifting, at least 2 times a week.  Take medicines as told by your health care provider.  Do not use any products that contain nicotine or tobacco, such as cigarettes and e-cigarettes. If you need help quitting, ask your health care provider.  Work with a Veterinary surgeoncounselor or diabetes educator to identify strategies to manage stress and any emotional and social challenges. Questions to ask a health care provider  Do I need to meet with a diabetes educator?  Do I need to meet with a dietitian?  What  number can I call if I have questions?  When are the best times to check my blood glucose? Where to find more information:  American Diabetes Association: diabetes.org  Academy of Nutrition and Dietetics: www.eatright.[employer]  National Institute of Diabetes and Digestive and Kidney Diseases (NIH): Car[employer].tnwww.niddk.nih.gov Summary  A healthy meal plan will help you control your blood glucose and maintain a healthy lifestyle.  Working with a diet and nutrition specialist (dietitian) can help you make a meal plan that is best for you.  Keep in mind that carbohydrates (carbs) and alcohol have immediate effects on your blood glucose levels. It is important to count carbs and to use alcohol carefully. This information is not intended to replace advice given to you by your health care provider. Make sure you discuss any questions you have with your health care provider. Document Released: 08/13/2005 Document Revised: 06/16/2017 Document Reviewed: 12/21/2016 Elsevier Interactive Patient Education  2019 [employer]

## 2019-04-01 ENCOUNTER — Encounter: Payer: Self-pay | Admitting: Physician Assistant

## 2019-04-03 MED ORDER — SEMAGLUTIDE(0.25 OR 0.5MG/DOS) 2 MG/1.5ML ~~LOC~~ SOPN: 0.5000 mg | PEN_INJECTOR | SUBCUTANEOUS | 2 refills | Status: DC

## 2019-04-11 ENCOUNTER — Telehealth (INDEPENDENT_AMBULATORY_CARE_PROVIDER_SITE_OTHER): Payer: 59 | Admitting: Physician Assistant

## 2019-04-11 ENCOUNTER — Encounter: Payer: Self-pay | Admitting: Physician Assistant

## 2019-04-11 VITALS — Temp 97.4°F | Ht 60.0 in | Wt 171.0 lb

## 2019-04-11 DIAGNOSIS — J069 Acute upper respiratory infection, unspecified: Secondary | ICD-10-CM | POA: Diagnosis not present

## 2019-04-11 DIAGNOSIS — B9789 Other viral agents as the cause of diseases classified elsewhere: Secondary | ICD-10-CM | POA: Diagnosis not present

## 2019-04-11 NOTE — Progress Notes (Deleted)
From patient's mychart message: "Sunday evening I developed a scratchy throat and then cough. Today I can feel it sort if in my chest. I also have post nasal drip but not much congestion. I have not had fever.   Under normal circumstances I would say that I have a cold. Often I come down with colds/allergies this time of year. However, because of COVID19, work requires that I "get cleared" by my doctor before returning to work."   Patient used some tessalon she had left over from old cold and it has not helped with cough. Some itching in ears but no pain. No headache. No other symptoms.

## 2019-04-11 NOTE — Telephone Encounter (Signed)
Spoke with Pt, scheduled for today with PCP

## 2019-04-11 NOTE — Telephone Encounter (Signed)
Left VM for Pt to return clinic call to be scheduled for virtual visit with PCP

## 2019-04-11 NOTE — Progress Notes (Signed)
Patient ID: Connie Dyer, female   DOB: December 31, 1965, 53 y.o.   MRN: [id number] .Marland KitchenVirtual Visit via Video Note  I connected with Connie Dyer on 04/12/19 at  1:40 PM EDT by a video enabled telemedicine application and verified that I am speaking with the correct person using two identifiers.  Location: Patient: home Provider: home   I discussed the limitations of evaluation and management by telemedicine and the availability of in person appointments. The patient expressed understanding and agreed to proceed.  History of Present Illness: Pt is a 53 yo female with T2DM, HTN, HLD who calls into the clinic with scratchy throat, cough, chest congestion. Symptoms started Sunday evening, 2 days ago. She has some PND. No fever, chills, body aches, SOB. She feels like it is a cold but her work sent her home today. She has used some tessalon pearls with little benefit. Has some itching in ears. No headache.   .. Active Ambulatory Problems    Diagnosis Date Noted  . Essential hypertension 04/03/2015  . PTSD (post-traumatic stress disorder) 04/03/2015  . History of colonic polyps 04/04/2015  . Hyperlipidemia 05/10/2015  . 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 Diabetes mellitus without complication (HCC) 07/13/2017  . Acute pain of left knee 05/24/2018   Resolved Ambulatory Problems    Diagnosis Date Noted  . Hyperglycemia 05/10/2015  . Type 2 diabetes mellitus (HCC) 07/24/2015  . Ankle fracture, left 12/03/2015   No Additional Past Medical History   Reviewed med, allergy, problem list.     Observations/Objective: No acute distress.  Normal breathing. Normal mood.  Normal appearance.  .. Today's Vitals   04/11/19 1138  Temp: (!) 97.4 F (36.3 C)  TempSrc: Oral  Weight: 171 lb (77.6 kg)  Height: 5' (1.524 m)   Body mass index is 33.4 kg/m.   Assessment and Plan: Marland KitchenMarland KitchenShelbi was seen today for sore throat.  Diagnoses and all orders for this visit:  Viral URI with  cough   Symptoms are early and we are in the middle of COVID pandemic. I would like to self isolate for 3 days. If symptoms improve ok to go back to work if worsening and runs fever, cough, SOB will continue to isolate for 7 days from symptoms and 3 days of clearance of fever as CDC recommends. Written out of work through Thursday and may return to work on Friday. Discussed symptomatic care with rest, hydration, tylenol cold sinus severe. Follow up as needed.   Follow Up Instructions:    I discussed the assessment and treatment plan with the patient. The patient was provided an opportunity to ask questions and all were answered. The patient agreed with the plan and demonstrated an understanding of the instructions.   The patient was advised to call back or seek an in-person evaluation if the symptoms worsen or if the condition fails to improve as anticipated.   I provided 15 minutes of non-face-to-face time during this encounter.   Tandy Gaw, PA-C

## 2019-04-12 ENCOUNTER — Encounter: Payer: Self-pay | Admitting: Physician Assistant

## 2019-05-22 ENCOUNTER — Ambulatory Visit: Payer: 59 | Admitting: Family Medicine

## 2019-05-23 ENCOUNTER — Other Ambulatory Visit: Payer: Self-pay

## 2019-05-23 ENCOUNTER — Ambulatory Visit (INDEPENDENT_AMBULATORY_CARE_PROVIDER_SITE_OTHER): Payer: 59 | Admitting: Family Medicine

## 2019-05-23 ENCOUNTER — Encounter: Payer: Self-pay | Admitting: Family Medicine

## 2019-05-23 ENCOUNTER — Ambulatory Visit (INDEPENDENT_AMBULATORY_CARE_PROVIDER_SITE_OTHER): Payer: 59

## 2019-05-23 VITALS — BP 122/64 | HR 82 | Temp 97.7°F | Wt 166.0 lb

## 2019-05-23 DIAGNOSIS — Z978 Presence of other specified devices: Secondary | ICD-10-CM

## 2019-05-23 DIAGNOSIS — M25572 Pain in left ankle and joints of left foot: Secondary | ICD-10-CM

## 2019-05-23 NOTE — Patient Instructions (Addendum)
Thank you for coming in today. I recommend that you contact [facility] and get follow up for prominent hardware.  I will send a copy of this note to Dr Noemi Chapel. Also let the staff know at check in. They should be able to pull up the note in Epic and the xray.     Orthopedic Hardware Removal Hardware removal is a procedure to remove from the body any medical parts-such as pins, screws, rods, wires, or plates-that were used to repair a broken bone. This procedure may be done to:  Remove medical parts that are normally removed after a broken bone has healed.  Remove medical parts that are causing problems, such as infection or pain.  Remove medical parts that are not working.  Replace medical parts with newer, better materials. Tell a health care provider about:  Any allergies you have.  All medicines you are taking, including vitamins, herbs, eye drops, creams, and over-the-counter medicines. This includes any use of steroids, either by mouth or in cream form.  Any problems you or family members have had with anesthetic medicines.  Any blood disorders you have.  Any surgeries you have had.  Any medical conditions you have.  Whether you are pregnant or may be pregnant. What are the risks? Generally, this is a safe procedure. However, problems may occur, including:  Infection.  Bleeding.  Allergic reactions to medicines.  Long-term pain.  The bone breaking again (refracture).  Damage to other structures or organs, such as blood vessels or nerves.  Failure to completely remove the medical parts.  Blood clots. What happens before the procedure? Medicines Ask your health care provider about:  Changing or stopping your regular medicines. This is especially important if you are taking diabetes medicines or blood thinners.  Taking medicines such as aspirin and ibuprofen. These medicines can thin your blood. Do not take these medicines unless your health care  provider tells you to take them.  Taking over-the-counter medicines, vitamins, herbs, and supplements. Staying hydrated Follow instructions from your health care provider about hydration, which may include:  Up to 2 hours before the procedure, you may continue to drink clear liquids, such as water, clear fruit juice, black coffee, and plain tea. Eating and drinking restrictions Follow instructions from your health care provider about eating and drinking, which may include:  Eight hours before the procedure: Stop eating heavy meals or foods, such as meat, fried foods, or fatty foods.  Six hours before the procedure: Stop eating light meals or foods, such as toast or cereal.  Six hours before the procedure: Stop drinking milk or drinks that contain milk.  Two hours before the procedure: Stop drinking clear liquids. General instructions  Do not use any products that contain nicotine or tobacco, such as cigarettes and e-cigarettes. These can delay incision and bone healing. If you need help quitting, ask your health care provider.  Plan to have someone take you home from the hospital or clinic.  Plan to have a responsible adult care for you for at least 24 hours after you leave the hospital or clinic. This is important.  Ask your health care provider how your surgical site will be marked or identified.  You may be asked to shower with a germ-killing soap.  Ask your health care provider what steps will be taken to help prevent infection. These may include: ? Removing hair at the surgery site. ? Washing skin with a germ-killing soap. ? Antibiotic medicine.  You may have  X-rays taken. What happens during the procedure?  An IV will be inserted into one of your veins.  You will be given one or more of the following: ? A medicine to help you relax (sedative). ? A medicine to numb the area (local anesthetic). ? A medicine to make you fall asleep (general anesthetic). ? A medicine that  is injected into your spine to numb the area below and slightly above the injection site (spinal anesthetic). ? A medicine that is injected into an area of your body to numb everything below the injection site (regional anesthetic).  The surgeon will make an incision over the area where the medical parts are located.  The medical parts will be removed.  The incision will be closed with stitches (sutures), staples, or surgical glue.  A bandage (dressing) will be placed over the incision site to keep it clean and dry.  A splint, cast, or removable walking boot may be applied until the area heals. The procedure may vary among health care providers and hospitals. What happens after the procedure?  Your blood pressure, heart rate, breathing rate, and blood oxygen level will be monitored until you leave the hospital or clinic.  You will be given medicine for pain as needed.  Do not drive for 24 hours if you were given a sedative during the procedure.  Ask your health care provider when it is safe to drive if you have a splint, cast, or removable walking boot on the injured area. Summary  Hardware removal is a procedure to remove from the body any medical parts-such as pins, screws, rods, wires, or plates-that were used to repair a broken bone.  Medical parts may need to be removed because they are causing problems or not working, or they may not be needed any longer.  Be sure to follow your health care provider's instructions for before and after the procedure. This information is not intended to replace advice given to you by your health care provider. Make sure you discuss any questions you have with your health care provider. Document Released: 09/13/2009 Document Revised: 12/09/2017 Document Reviewed: 12/09/2017 Elsevier Interactive Patient Education  2019 [employer]

## 2019-05-23 NOTE — Progress Notes (Signed)
Ebonique Hallstrom is a 53 y.o. female who presents to [facility] today for left ankle lateral nodule at scar.  Larena Glassman suffered a left lateral ankle fracture in January 2017.  This was repaired with ORIF with plate overlying distal fibula.  She was doing well until a few days ago when she noticed a small nodule at the distal lateral ankle.  She notes is not particular painful but does tend to rub especially when she wears boots or shoes.  She is worried that she may bump it on objects as well.  She denies fevers or chills and feels well otherwise.    ROS:  As above  Exam:  BP 122/64   Pulse 82   Temp 97.7 F (36.5 C) (Oral)   Wt 166 lb (75.3 kg)   BMI 32.42 kg/m  Wt Readings from Last 5 Encounters:  05/23/19 166 lb (75.3 kg)  04/11/19 171 lb (77.6 kg)  03/24/19 171 lb (77.6 kg)  12/09/18 174 lb (78.9 kg)  05/24/18 176 lb (79.8 kg)   General: Well Developed, well nourished, and in no acute distress.  Neuro/Psych: Alert and oriented x3, extra-ocular muscles intact, able to move all 4 extremities, sensation grossly intact. Skin: Warm and dry, no rashes noted.  Respiratory: Not using accessory muscles, speaking in full sentences, trachea midline.  Cardiovascular: Pulses palpable, no extremity edema. Abdomen: Does not appear distended. MSK: Left ankle: Mature scar at lateral ankle.  Small nodule overlying lateral malleolus.  Not particularly tender.  Normal ankle motion.    Lab and Radiology Results X-ray images left ankle personally independently reviewed Intact hardware however most distal screw at left fibula plate appears a bit backed out and more prominent.  No fractures.  No other significant changes. Await formal radiology over read    Assessment and Plan: 53 y.o. female with prominent surgical hardware screw at lateral ankle.  Skin is not completely tented but is at risk for becoming eroded or injured.  I think it is reasonable  to reassess with orthopedics.  She may benefit from removal of hardware.  Discussed pros and cons.  Follow back up with [place] [facility].  Will send copy of today's note to Dr. Noemi Chapel.   PDMP not reviewed this encounter. Orders Placed This Encounter  Procedures  . DG Ankle Complete Left    Standing Status:   Future    Number of Occurrences:   1    Standing Expiration Date:   07/22/2020    Order Specific Question:   Reason for Exam (SYMPTOM  OR DIAGNOSIS REQUIRED)    Answer:   eval hardware left ankle    Order Specific Question:   Is patient pregnant?    Answer:   No    Order Specific Question:   Preferred imaging location?    Answer:   [facility]    Order Specific Question:   Radiology Contrast Protocol - do NOT remove file path    Answer:   [email or web address]   No orders of the defined types were placed in this encounter.   Historical information moved to improve visibility of documentation.  Past Medical History:  Diagnosis Date  . Ankle fracture, left 12/03/2015  . Diabetes mellitus without complication ([place]) 8/46/9629  . Essential hypertension 04/03/2015  . 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 History of colonic polyps 04/04/2015   2015: benign adenoma and sessile serrated polyp.   Marland Kitchen  Hyperlipidemia 05/10/2015   AHA 10 year risk of 2.3% June 2016   . PTSD (post-traumatic stress disorder) 04/03/2015   Past Surgical History:  Procedure Laterality Date  . CHOLECYSTECTOMY     Social History   Tobacco Use  . Smoking status: Never Smoker  . Smokeless tobacco: Never Used  Substance Use Topics  . Alcohol use: Not on file   family history is not on file.  Medications: Current Outpatient Medications  Medication Sig Dispense Refill  . Cholecalciferol (VITAMIN D3) 125 MCG (5000 UT) TABS Take by mouth daily.    . hydrochlorothiazide (HYDRODIURIL) 25 MG tablet Take 1 tablet (25 mg total) by mouth  daily. 90 tablet 4  . lisinopril (ZESTRIL) 10 MG tablet Take 1 tablet (10 mg total) by mouth daily. 90 tablet 4  . loratadine (CLARITIN) 10 MG tablet Take 10 mg by mouth daily.    . Semaglutide,0.25 or 0.5MG /DOS, (OZEMPIC, 0.25 OR 0.5 MG/DOSE,) 2 MG/1.5ML SOPN Inject 0.5 mg into the skin once a week. 1 pen 2  . sertraline (ZOLOFT) 100 MG tablet TAKE 1 TABLET DAILY. 90 tablet 4   No current facility-administered medications for this visit.    No Known Allergies    Discussed warning signs or symptoms. Please see discharge instructions. Patient expresses understanding.

## 2019-05-24 ENCOUNTER — Encounter: Payer: Self-pay | Admitting: Physician Assistant

## 2019-05-24 DIAGNOSIS — E785 Hyperlipidemia, unspecified: Secondary | ICD-10-CM | POA: Insufficient documentation

## 2019-05-24 LAB — LIPID PANEL W/REFLEX DIRECT LDL
Cholesterol: 206 mg/dL — ABNORMAL HIGH (ref <200)
HDL: 49 mg/dL — ABNORMAL LOW (ref >=50)
LDL Cholesterol (Calc): 138 mg/dL (calc) — ABNORMAL HIGH
Non-HDL Cholesterol (Calc): 157 mg/dL (calc) — ABNORMAL HIGH (ref <130)
Total CHOL/HDL Ratio: 4.2 (calc) (ref <5.0)
Triglycerides: 90 mg/dL (ref <150)

## 2019-05-24 LAB — COMPLETE METABOLIC PANEL WITH GFR
AG Ratio: 2.1 (calc) (ref 1.0–2.5)
ALT: 10 U/L (ref 6–29)
AST: 13 U/L (ref 10–35)
Albumin: 4.8 g/dL (ref 3.6–5.1)
Alkaline phosphatase (APISO): 18 U/L — ABNORMAL LOW (ref 37–153)
BUN: 20 mg/dL (ref 7–25)
CO2: 28 mmol/L (ref 20–32)
Calcium: 9.6 mg/dL (ref 8.6–10.4)
Chloride: 97 mmol/L — ABNORMAL LOW (ref 98–110)
Creat: 0.88 mg/dL (ref 0.50–1.05)
GFR, Est African American: 86 mL/min/{1.73_m2} (ref >=60)
GFR, Est Non African American: 74 mL/min/{1.73_m2} (ref >=60)
Globulin: 2.3 g/dL (calc) (ref 1.9–3.7)
Glucose, Bld: 138 mg/dL — ABNORMAL HIGH (ref 65–99)
Potassium: 4.1 mmol/L (ref 3.5–5.3)
Sodium: 134 mmol/L — ABNORMAL LOW (ref 135–146)
Total Bilirubin: 0.6 mg/dL (ref 0.2–1.2)
Total Protein: 7.1 g/dL (ref 6.1–8.1)

## 2019-05-24 MED ORDER — ATORVASTATIN CALCIUM 40 MG PO TABS: 40.0000 mg | ORAL_TABLET | Freq: Every day | ORAL | 3 refills | Status: DC

## 2019-05-24 NOTE — Progress Notes (Signed)
Cholesterol continues to go up. Your LDL goal is under 70 you are at 138.  Your 10 year CV risk is 10.6 percent. Will you consider statin to lower CV risk?

## 2019-06-14 LAB — HM DIABETES EYE EXAM

## 2019-06-22 ENCOUNTER — Encounter: Payer: Self-pay | Admitting: Physician Assistant

## 2019-06-22 ENCOUNTER — Ambulatory Visit: Payer: 59 | Admitting: Physician Assistant

## 2019-06-23 ENCOUNTER — Ambulatory Visit: Payer: 59 | Admitting: Physician Assistant

## 2019-07-01 ENCOUNTER — Other Ambulatory Visit: Payer: Self-pay | Admitting: Physician Assistant

## 2019-07-30 ENCOUNTER — Other Ambulatory Visit: Payer: Self-pay | Admitting: Physician Assistant

## 2019-09-06 ENCOUNTER — Other Ambulatory Visit: Payer: Self-pay | Admitting: Physician Assistant

## 2019-09-19 ENCOUNTER — Other Ambulatory Visit: Payer: Self-pay | Admitting: Physician Assistant

## 2019-09-20 MED ORDER — OZEMPIC (0.25 OR 0.5 MG/DOSE) 2 MG/1.5ML ~~LOC~~ SOPN: 0.5000 mg | PEN_INJECTOR | SUBCUTANEOUS | 0 refills | Status: DC

## 2019-09-26 ENCOUNTER — Telehealth (INDEPENDENT_AMBULATORY_CARE_PROVIDER_SITE_OTHER): Payer: 59 | Admitting: Physician Assistant

## 2019-09-26 ENCOUNTER — Encounter: Payer: Self-pay | Admitting: Physician Assistant

## 2019-09-26 VITALS — Temp 97.4°F | Ht 60.0 in | Wt 153.0 lb

## 2019-09-26 DIAGNOSIS — Z1231 Encounter for screening mammogram for malignant neoplasm of breast: Secondary | ICD-10-CM

## 2019-09-26 DIAGNOSIS — E785 Hyperlipidemia, unspecified: Secondary | ICD-10-CM

## 2019-09-26 DIAGNOSIS — I10 Essential (primary) hypertension: Secondary | ICD-10-CM | POA: Diagnosis not present

## 2019-09-26 DIAGNOSIS — E119 Type 2 diabetes mellitus without complications: Secondary | ICD-10-CM

## 2019-09-26 MED ORDER — AMBULATORY NON FORMULARY MEDICATION: 0 refills | Status: AC

## 2019-09-26 MED ORDER — AMBULATORY NON FORMULARY MEDICATION: 0 refills | Status: DC

## 2019-09-26 MED ORDER — OZEMPIC (0.25 OR 0.5 MG/DOSE) 2 MG/1.5ML ~~LOC~~ SOPN: 0.5000 mg | PEN_INJECTOR | SUBCUTANEOUS | 0 refills | Status: DC

## 2019-09-26 NOTE — Progress Notes (Signed)
Patient ID: Connie Dyer, female   DOB: 1966/03/16, 53 y.o.   MRN: [id number] .Marland KitchenVirtual Visit via Video Note  I connected with Connie Dyer on 09/26/19 at  8:10 AM EDT by a video enabled telemedicine application and verified that I am speaking with the correct person using two identifiers.  Location: Patient: home Provider: clinic   I discussed the limitations of evaluation and management by telemedicine and the availability of in person appointments. The patient expressed understanding and agreed to proceed.  History of Present Illness: Pt is a 53 yo female with T2DM, HTN, HLD who presents to the clinic for follow up and medication refill.   Pt is not checking sugars because glucometer broke. She is taking ozempic and doing well. Lost 13lbs. Staying active. Feels great. No open sores or wounds. No hypoglycemia.   Admits to not taking lipitor every day. On average every 3 days.  .. Active Ambulatory Problems    Diagnosis Date Noted  . Essential hypertension 04/03/2015  . PTSD (post-traumatic stress disorder) 04/03/2015  . History of colonic polyps 04/04/2015  . Hyperlipidemia 05/10/2015  . 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 Diabetes mellitus without complication ([place]) 09/23/8526  . Acute pain of left knee 05/24/2018  . Dyslipidemia, goal LDL below 70 05/24/2019   Resolved Ambulatory Problems    Diagnosis Date Noted  . Hyperglycemia 05/10/2015  . Type 2 diabetes mellitus ([place]) 07/24/2015  . Ankle fracture, left 12/03/2015   No Additional Past Medical History   Reviewed med, allergy, problem list.   Observations/Objective: No acute distress. Normal mood and appearance.  Normal breathing.   .. Today's Vitals   09/26/19 0813  Temp: (!) 97.4 F (36.3 C)  TempSrc: Oral  Weight: 153 lb (69.4 kg)  Height: 5' (1.524 m)   Body mass index is 29.88 kg/m.    Assessment and Plan: Marland KitchenMarland KitchenDiagnoses and all orders for this visit:  Diabetes mellitus without complication  ([place]) -     Semaglutide,0.25 or 0.5MG /DOS, (OZEMPIC, 0.25 OR 0.5 MG/DOSE,) 2 MG/1.5ML SOPN; Inject 0.5 mg as directed once a week. -     Discontinue: AMBULATORY NON FORMULARY MEDICATION; Glucometer device test strips and lancets -     AMBULATORY NON FORMULARY MEDICATION; Glucometer device test strips and lancets needed for diabetes 250.00 to check sugars twice a day.  Visit for screening mammogram -     MM 3D SCREEN BREAST BILATERAL  Essential hypertension  Dyslipidemia, goal LDL below 70   Ordered A!C to follow up on DM. Lost 13lbs great job. Last a1c 7.3.  New glucometer ordered.  Continue on ozempic. On ACE. On STATIN. Encouraged to take every other day to start off. Will recheck in next 3 months.  Needs foot exam.  UTD eye exam. Vaccines UTD.  Needs mammogram.   Continue to work on healthy diet and exercise.    Follow Up Instructions:    I discussed the assessment and treatment plan with the patient. The patient was provided an opportunity to ask questions and all were answered. The patient agreed with the plan and demonstrated an understanding of the instructions.   The patient was advised to call back or seek an in-person evaluation if the symptoms worsen or if the condition fails to improve as anticipated.   Iran Planas, PA-C

## 2019-09-26 NOTE — Progress Notes (Signed)
Doing well. Needs refills Ozempic. PHQ9-GAD7 completed.

## 2019-10-03 ENCOUNTER — Other Ambulatory Visit: Payer: Self-pay | Admitting: Neurology

## 2019-10-03 MED ORDER — ACCU-CHEK GUIDE W/DEVICE KIT: 1.0000 | PACK | Freq: Every day | 0 refills | Status: AC

## 2019-11-03 ENCOUNTER — Ambulatory Visit: Payer: 59

## 2019-11-07 ENCOUNTER — Ambulatory Visit: Payer: 59

## 2019-12-17 ENCOUNTER — Encounter: Payer: Self-pay | Admitting: Physician Assistant

## 2019-12-17 DIAGNOSIS — I10 Essential (primary) hypertension: Secondary | ICD-10-CM

## 2019-12-17 DIAGNOSIS — F4310 Post-traumatic stress disorder, unspecified: Secondary | ICD-10-CM

## 2019-12-18 MED ORDER — HYDROCHLOROTHIAZIDE 25 MG PO TABS: 25.0000 mg | ORAL_TABLET | Freq: Every day | ORAL | 1 refills | Status: DC

## 2019-12-18 MED ORDER — SERTRALINE HCL 100 MG PO TABS: ORAL_TABLET | ORAL | 1 refills | Status: DC

## 2019-12-18 MED ORDER — LISINOPRIL 10 MG PO TABS: 10.0000 mg | ORAL_TABLET | Freq: Every day | ORAL | 1 refills | Status: DC

## 2019-12-18 MED ORDER — ATORVASTATIN CALCIUM 40 MG PO TABS: 40.0000 mg | ORAL_TABLET | Freq: Every day | ORAL | 1 refills | Status: DC

## 2019-12-18 NOTE — Telephone Encounter (Signed)
Prescriptions sent

## 2019-12-20 ENCOUNTER — Other Ambulatory Visit: Payer: Self-pay | Admitting: Neurology

## 2019-12-20 DIAGNOSIS — E119 Type 2 diabetes mellitus without complications: Secondary | ICD-10-CM

## 2019-12-20 MED ORDER — OZEMPIC (0.25 OR 0.5 MG/DOSE) 2 MG/1.5ML ~~LOC~~ SOPN: 0.5000 mg | PEN_INJECTOR | SUBCUTANEOUS | 0 refills | Status: DC

## 2020-03-01 ENCOUNTER — Other Ambulatory Visit: Payer: Self-pay | Admitting: Physician Assistant

## 2020-03-01 DIAGNOSIS — E119 Type 2 diabetes mellitus without complications: Secondary | ICD-10-CM

## 2020-03-08 ENCOUNTER — Encounter: Payer: Self-pay | Admitting: Physician Assistant

## 2020-03-08 ENCOUNTER — Ambulatory Visit (INDEPENDENT_AMBULATORY_CARE_PROVIDER_SITE_OTHER): Payer: 59 | Admitting: Physician Assistant

## 2020-03-08 VITALS — BP 111/64 | HR 92 | Ht 60.0 in | Wt 159.0 lb

## 2020-03-08 DIAGNOSIS — E1169 Type 2 diabetes mellitus with other specified complication: Secondary | ICD-10-CM | POA: Diagnosis not present

## 2020-03-08 DIAGNOSIS — Z131 Encounter for screening for diabetes mellitus: Secondary | ICD-10-CM | POA: Diagnosis not present

## 2020-03-08 DIAGNOSIS — E785 Hyperlipidemia, unspecified: Secondary | ICD-10-CM

## 2020-03-08 LAB — POCT GLYCOSYLATED HEMOGLOBIN (HGB A1C): Hemoglobin A1C: 5.7 % — AB (ref 4.0–5.6)

## 2020-03-08 MED ORDER — OZEMPIC (0.25 OR 0.5 MG/DOSE) 2 MG/1.5ML ~~LOC~~ SOPN: 0.5000 mg | PEN_INJECTOR | SUBCUTANEOUS | 0 refills | Status: DC

## 2020-03-08 NOTE — Progress Notes (Signed)
Subjective:    Patient ID: Connie Dyer, female    DOB: Nov 13, 1966, 54 y.o.   MRN: [id number]  HPI  Patient is a 54 year old female with hypertension, diabetes, dyslipidemia who presents to the clinic for medication refills and management.  Patient is doing great.  She is compliant with all medications and not having any side effects.  She denies any chest pain, palpitations, headaches, vision changes, dizziness.  She is trying to stay active.  She denies any open sores or wounds.  She is not checking her glucose.  She denies any hypoglycemic events.  .. Active Ambulatory Problems    Diagnosis Date Noted  . Essential hypertension 04/03/2015  . PTSD (post-traumatic stress disorder) 04/03/2015  . History of colonic polyps 04/04/2015  . Hyperlipidemia 05/10/2015  . 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 Diabetes mellitus without complication (HCC) 07/13/2017  . Acute pain of left knee 05/24/2018  . Dyslipidemia, goal LDL below 70 05/24/2019   Resolved Ambulatory Problems    Diagnosis Date Noted  . Hyperglycemia 05/10/2015  . Type 2 diabetes mellitus (HCC) 07/24/2015  . Ankle fracture, left 12/03/2015   No Additional Past Medical History       Review of Systems  All other systems reviewed and are negative.      Objective:   Physical Exam Vitals reviewed.  Constitutional:      Appearance: Normal appearance. She is obese.  HENT:     Head: Normocephalic.  Cardiovascular:     Rate and Rhythm: Normal rate and regular rhythm.     Pulses: Normal pulses.  Pulmonary:     Effort: Pulmonary effort is normal.     Breath sounds: Normal breath sounds.  Musculoskeletal:     Cervical back: Normal range of motion and neck supple.  Lymphadenopathy:     Cervical: No cervical adenopathy.  Neurological:     General: No focal deficit present.     Mental Status: She is alert and oriented to person, place, and time.  Psychiatric:        Mood and Affect: Mood normal.          Assessment & Plan:  Marland KitchenMarland KitchenToneshia was seen today for diabetes and follow-up.  Diagnoses and all orders for this visit:  Controlled type 2 diabetes mellitus with other specified complication, without long-term current use of insulin (HCC) -     Cancel: COMPLETE METABOLIC PANEL WITH GFR -     Hemoglobin A1c -     POCT glycosylated hemoglobin (Hb A1C) -     Semaglutide,0.25 or 0.5MG /DOS, (OZEMPIC, 0.25 OR 0.5 MG/DOSE,) 2 MG/1.5ML SOPN; Inject 0.5 mg as directed once a week. -     COMPLETE METABOLIC PANEL WITH GFR  Screening for diabetes mellitus -     Cancel: COMPLETE METABOLIC PANEL WITH GFR  Dyslipidemia, goal LDL below 70 -     Cancel: Lipid Panel w/reflex Direct LDL -     Lipid Panel w/reflex Direct LDL -     COMPLETE METABOLIC PANEL WITH GFR   .Marland Kitchen Results for orders placed or performed in visit on 03/08/20  POCT glycosylated hemoglobin (Hb A1C)  Result Value Ref Range   Hemoglobin A1C 5.7 (A) 4.0 - 5.6 %   HbA1c POC (<> result, manual entry)     HbA1c, POC (prediabetic range)     HbA1c, POC (controlled diabetic range)     A1C is great and stable.  Fasting labs ordered. Continue on same medications.  On statin. Recheck lipids  today.  On ACE. BP to goal.  .. Diabetic Foot Exam - Simple   Simple Foot Form Diabetic Foot exam was performed with the following findings: Yes 03/08/2020  2:09 PM  Visual Inspection No deformities, no ulcerations, no other skin breakdown bilaterally: Yes Sensation Testing Intact to touch and monofilament testing bilaterally: Yes See comments: Yes Pulse Check Posterior Tibialis and Dorsalis pulse intact bilaterally: Yes Comments No feeling to monofilament testing on top of left foot due to surgery    Eye exam UTD.  Pt will get pneumonia after covid vaccine finished.  Needs shingles up to it in near future. May make nurse visit 14 days after covid vaccine.   Follow up in 3 months.

## 2020-03-11 ENCOUNTER — Encounter: Payer: Self-pay | Admitting: Physician Assistant

## 2020-03-23 LAB — LIPID PANEL W/REFLEX DIRECT LDL
Cholesterol: 232 mg/dL — ABNORMAL HIGH (ref <200)
HDL: 67 mg/dL (ref >=50)
LDL Cholesterol (Calc): 145 mg/dL (calc) — ABNORMAL HIGH
Non-HDL Cholesterol (Calc): 165 mg/dL (calc) — ABNORMAL HIGH (ref <130)
Total CHOL/HDL Ratio: 3.5 (calc) (ref <5.0)
Triglycerides: 96 mg/dL (ref <150)

## 2020-03-23 LAB — COMPLETE METABOLIC PANEL WITH GFR
AG Ratio: 2 (calc) (ref 1.0–2.5)
ALT: 14 U/L (ref 6–29)
AST: 13 U/L (ref 10–35)
Albumin: 4.7 g/dL (ref 3.6–5.1)
Alkaline phosphatase (APISO): 21 U/L — ABNORMAL LOW (ref 37–153)
BUN: 18 mg/dL (ref 7–25)
CO2: 31 mmol/L (ref 20–32)
Calcium: 9.9 mg/dL (ref 8.6–10.4)
Chloride: 97 mmol/L — ABNORMAL LOW (ref 98–110)
Creat: 0.88 mg/dL (ref 0.50–1.05)
GFR, Est African American: 86 mL/min/{1.73_m2} (ref >=60)
GFR, Est Non African American: 74 mL/min/{1.73_m2} (ref >=60)
Globulin: 2.4 g/dL (calc) (ref 1.9–3.7)
Glucose, Bld: 131 mg/dL — ABNORMAL HIGH (ref 65–99)
Potassium: 4.6 mmol/L (ref 3.5–5.3)
Sodium: 137 mmol/L (ref 135–146)
Total Bilirubin: 0.7 mg/dL (ref 0.2–1.2)
Total Protein: 7.1 g/dL (ref 6.1–8.1)

## 2020-03-23 LAB — HEMOGLOBIN A1C
Hgb A1c MFr Bld: 5.9 % of total Hgb — ABNORMAL HIGH (ref <5.7)
Mean Plasma Glucose: 123 (calc)
eAG (mmol/L): 6.8 (calc)

## 2020-03-24 NOTE — Progress Notes (Signed)
Connie Dyer,   LDL is not go goal of under 70 with diabetes. Need to start lipitor daily. Recheck in 6 months. Ok to send refills if needed.

## 2020-05-18 ENCOUNTER — Encounter: Payer: Self-pay | Admitting: Physician Assistant

## 2020-05-27 ENCOUNTER — Telehealth: Payer: 59 | Admitting: Physician Assistant

## 2020-06-21 ENCOUNTER — Encounter: Payer: Self-pay | Admitting: Physician Assistant

## 2020-06-21 ENCOUNTER — Ambulatory Visit (INDEPENDENT_AMBULATORY_CARE_PROVIDER_SITE_OTHER): Payer: 59 | Admitting: Physician Assistant

## 2020-06-21 VITALS — BP 121/56 | HR 96 | Ht 60.0 in | Wt 170.0 lb

## 2020-06-21 DIAGNOSIS — R4589 Other symptoms and signs involving emotional state: Secondary | ICD-10-CM

## 2020-06-21 DIAGNOSIS — Z23 Encounter for immunization: Secondary | ICD-10-CM | POA: Diagnosis not present

## 2020-06-21 DIAGNOSIS — F4310 Post-traumatic stress disorder, unspecified: Secondary | ICD-10-CM | POA: Diagnosis not present

## 2020-06-21 DIAGNOSIS — F411 Generalized anxiety disorder: Secondary | ICD-10-CM

## 2020-06-21 DIAGNOSIS — E1169 Type 2 diabetes mellitus with other specified complication: Secondary | ICD-10-CM

## 2020-06-21 DIAGNOSIS — I10 Essential (primary) hypertension: Secondary | ICD-10-CM

## 2020-06-21 LAB — POCT GLYCOSYLATED HEMOGLOBIN (HGB A1C): Hemoglobin A1C: 5.9 % — AB (ref 4.0–5.6)

## 2020-06-21 MED ORDER — HYDROCHLOROTHIAZIDE 25 MG PO TABS: 25.0000 mg | ORAL_TABLET | Freq: Every day | ORAL | 1 refills | Status: DC

## 2020-06-21 MED ORDER — LISINOPRIL 10 MG PO TABS: 10.0000 mg | ORAL_TABLET | Freq: Every day | ORAL | 1 refills | Status: DC

## 2020-06-21 MED ORDER — CITALOPRAM HYDROBROMIDE 10 MG PO TABS: ORAL_TABLET | ORAL | 1 refills | Status: DC

## 2020-06-21 MED ORDER — OZEMPIC (0.25 OR 0.5 MG/DOSE) 2 MG/1.5ML ~~LOC~~ SOPN: 1.0000 mg | PEN_INJECTOR | SUBCUTANEOUS | 0 refills | Status: DC

## 2020-06-21 NOTE — Patient Instructions (Signed)
Take 100mg  of zoloft for 3 days then start 1/2 tablet of zoloft 100mg  and 1 tablet of celexa 10mg  then after 7 days stop zoloft and start 2 tablets of celexa.

## 2020-06-21 NOTE — Progress Notes (Signed)
Subjective:    Patient ID: Connie Dyer, female    DOB: 12/31/1965, 54 y.o.   MRN: [id number]  HPI  Pt is a 54 yo female with HTN, T2DM, Dyslipidemia, GAD, depressed mood who presents to the clinic to discuss worsening anxiety and depression.   Pt has been on zoloft 100mg  for 10 plus years. She feels like it is not working anymore. She tried the 150mg  dose and just seemed to make mood worse. She would like to make a medication change. She feels more anxious, irritable, short fused. She started zoloft for more depressed mood. She is not sleeping well.   Pt is not checking her sugars. She is taking ozempic. Denies any hypoglycemia.   .. Active Ambulatory Problems    Diagnosis Date Noted  . Essential hypertension 04/03/2015  . PTSD (post-traumatic stress disorder) 04/03/2015  . History of colonic polyps 04/04/2015  . Hyperlipidemia 05/10/2015  . 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 Diabetes mellitus without complication (HCC) 07/13/2017  . Acute pain of left knee 05/24/2018  . Dyslipidemia, goal LDL below 70 05/24/2019  . Depressed mood 06/24/2020  . GAD (generalized anxiety disorder) 06/24/2020   Resolved Ambulatory Problems    Diagnosis Date Noted  . Hyperglycemia 05/10/2015  . Type 2 diabetes mellitus (HCC) 07/24/2015  . Ankle fracture, left 12/03/2015   No Additional Past Medical History     Review of Systems  All other systems reviewed and are negative.      Objective:   Physical Exam Vitals reviewed.  Constitutional:      Appearance: Normal appearance. She is obese.  Cardiovascular:     Rate and Rhythm: Normal rate and regular rhythm.     Pulses: Normal pulses.     Heart sounds: Normal heart sounds.  Pulmonary:     Effort: Pulmonary effort is normal.     Breath sounds: Normal breath sounds.  Neurological:     General: No focal deficit present.     Mental Status: She is alert and oriented to person, place, and time.  Psychiatric:        Mood and Affect:  Mood normal.    .. Depression screen [facility] 2/9 06/21/2020 03/08/2020 09/26/2019 03/24/2019 05/24/2018  Decreased Interest 2 0 0 0 0  Down, Depressed, Hopeless 2 1 0 1 0  PHQ - 2 Score 4 1 0 1 0  Altered sleeping 2 0 - 0 0  Tired, decreased energy 2 1 - 0 1  Change in appetite 0 0 - 0 0  Feeling bad or failure about yourself  1 1 - 0 0  Trouble concentrating 0 0 - 0 0  Moving slowly or fidgety/restless 0 0 - 0 0  Suicidal thoughts 0 0 - 0 0  PHQ-9 Score 9 3 - 1 1  Difficult doing work/chores Somewhat difficult Not difficult at all - Not difficult at all Not difficult at all   .03/26/2019 GAD 7 : Generalized Anxiety Score 06/21/2020 03/08/2020 09/26/2019 03/24/2019  Nervous, Anxious, on Edge 3 2 1 1   Control/stop worrying 1 0 0 0  Worry too much - different things 1 0 0 0  Trouble relaxing 1 0 0 0  Restless 1 0 0 0  Easily annoyed or irritable 3 1 1 1   Afraid - awful might happen 0 0 0 0  Total GAD 7 Score 10 3 2 2   Anxiety Difficulty Somewhat difficult Not difficult at all Not difficult at all Not difficult at all  Assessment & Plan:  Marland KitchenMarland KitchenDonella was seen today for anxiety.  Diagnoses and all orders for this visit:  GAD (generalized anxiety disorder) -     citalopram (CELEXA) 10 MG tablet; Take one tablet for 7 days then increse to 2 tablets daily.  PTSD (post-traumatic stress disorder)  Controlled type 2 diabetes mellitus with other specified complication, without long-term current use of insulin (HCC) -     Semaglutide,0.25 or 0.5MG /DOS, (OZEMPIC, 0.25 OR 0.5 MG/DOSE,) 2 MG/1.5ML SOPN; Inject 0.75 mLs (1 mg total) as directed once a week. -     POCT glycosylated hemoglobin (Hb A1C)  Essential hypertension -     hydrochlorothiazide (HYDRODIURIL) 25 MG tablet; Take 1 tablet (25 mg total) by mouth daily. -     lisinopril (ZESTRIL) 10 MG tablet; Take 1 tablet (10 mg total) by mouth daily.  Need for pneumococcal vaccine -     Pneumococcal polysaccharide vaccine 23-valent  greater than or equal to 2yo subcutaneous/IM  Depressed mood -     citalopram (CELEXA) 10 MG tablet; Take one tablet for 7 days then increse to 2 tablets daily.   PHQ/GAD increased.  Taper off zoloft.  Titrate up on celexa.  Follow up in 6 weeks.  Encouraged meditation/counseling/exercise to help with mood.   .. Lab Results  Component Value Date   HGBA1C 5.9 (A) 06/21/2020   A1C looks great.  Continue ozempic. Did increase 1mg  for weight loss goals.  On ACE. BP to goal.  On STATIN.  Pneumonia vaccine given today.  Needs eye exam.

## 2020-06-24 ENCOUNTER — Encounter: Payer: Self-pay | Admitting: Physician Assistant

## 2020-06-24 ENCOUNTER — Other Ambulatory Visit: Payer: Self-pay | Admitting: Neurology

## 2020-06-24 DIAGNOSIS — R4589 Other symptoms and signs involving emotional state: Secondary | ICD-10-CM | POA: Insufficient documentation

## 2020-06-24 DIAGNOSIS — F411 Generalized anxiety disorder: Secondary | ICD-10-CM | POA: Insufficient documentation

## 2020-06-24 MED ORDER — OZEMPIC (1 MG/DOSE) 2 MG/1.5ML ~~LOC~~ SOPN: 1.0000 mg | PEN_INJECTOR | SUBCUTANEOUS | 0 refills | Status: DC

## 2020-07-13 ENCOUNTER — Other Ambulatory Visit: Payer: Self-pay | Admitting: Physician Assistant

## 2020-07-13 DIAGNOSIS — F411 Generalized anxiety disorder: Secondary | ICD-10-CM

## 2020-07-13 DIAGNOSIS — R4589 Other symptoms and signs involving emotional state: Secondary | ICD-10-CM

## 2020-08-09 ENCOUNTER — Other Ambulatory Visit: Payer: Self-pay | Admitting: Physician Assistant

## 2020-08-09 DIAGNOSIS — E1169 Type 2 diabetes mellitus with other specified complication: Secondary | ICD-10-CM

## 2020-09-01 ENCOUNTER — Other Ambulatory Visit: Payer: Self-pay | Admitting: Physician Assistant

## 2020-09-01 DIAGNOSIS — F4310 Post-traumatic stress disorder, unspecified: Secondary | ICD-10-CM

## 2020-09-05 LAB — HM DIABETES EYE EXAM

## 2020-09-20 ENCOUNTER — Ambulatory Visit (INDEPENDENT_AMBULATORY_CARE_PROVIDER_SITE_OTHER): Payer: 59 | Admitting: Physician Assistant

## 2020-09-20 ENCOUNTER — Encounter: Payer: Self-pay | Admitting: Physician Assistant

## 2020-09-20 VITALS — BP 104/62 | HR 83 | Ht 60.0 in | Wt 162.0 lb

## 2020-09-20 DIAGNOSIS — H539 Unspecified visual disturbance: Secondary | ICD-10-CM

## 2020-09-20 DIAGNOSIS — F411 Generalized anxiety disorder: Secondary | ICD-10-CM

## 2020-09-20 DIAGNOSIS — E785 Hyperlipidemia, unspecified: Secondary | ICD-10-CM | POA: Diagnosis not present

## 2020-09-20 DIAGNOSIS — I10 Essential (primary) hypertension: Secondary | ICD-10-CM

## 2020-09-20 DIAGNOSIS — E1169 Type 2 diabetes mellitus with other specified complication: Secondary | ICD-10-CM

## 2020-09-20 DIAGNOSIS — Z23 Encounter for immunization: Secondary | ICD-10-CM | POA: Diagnosis not present

## 2020-09-20 DIAGNOSIS — R4589 Other symptoms and signs involving emotional state: Secondary | ICD-10-CM

## 2020-09-20 LAB — POCT GLYCOSYLATED HEMOGLOBIN (HGB A1C): Hemoglobin A1C: 5.4 % (ref 4.0–5.6)

## 2020-09-20 MED ORDER — CITALOPRAM HYDROBROMIDE 20 MG PO TABS: 20.0000 mg | ORAL_TABLET | Freq: Every day | ORAL | 0 refills | Status: DC

## 2020-09-20 MED ORDER — OZEMPIC (1 MG/DOSE) 2 MG/1.5ML ~~LOC~~ SOPN: 1.0000 mg | PEN_INJECTOR | SUBCUTANEOUS | 0 refills | Status: DC

## 2020-09-20 MED ORDER — ATORVASTATIN CALCIUM 40 MG PO TABS: 40.0000 mg | ORAL_TABLET | Freq: Every day | ORAL | 1 refills | Status: DC

## 2020-09-20 NOTE — Progress Notes (Signed)
Subjective:    Patient ID: Connie Dyer, female    DOB: 1966/06/23, 54 y.o.   MRN: [id number]  HPI  Patient is a 54 year old female with type 2 diabetes, hypertension, hyperlipidemia, depression, anxiety who presents to the clinic for medication follow-up.  Patient is taking Ozempic weekly and doing great.  She denies any side effects.  It has helped with her appetite and she is losing weight.  He is not checking her sugars.  She denies any hypoglycemic symptoms.  She denies any open sores or wounds.  Overall she feels really good.  Patient is taking both ACE inhibitor and HCTZ.  She has noticed some times where she feels overall dizzy and has some vision blurriness and changes.  She has been to the eye doctor.  She got a complete blink good check.  Vision changes are episodic in both eyes.  There is no headache or pain associated.  The last for a few seconds.  The back pain associated with working on computer.  Patient denies any chest pain, palpitations, shortness of breath.  Anxiety and depression are controlled.  She is taking Celexa daily.  .. Active Ambulatory Problems    Diagnosis Date Noted  . Essential hypertension 04/03/2015  . PTSD (post-traumatic stress disorder) 04/03/2015  . History of colonic polyps 04/04/2015  . Hyperlipidemia 05/10/2015  . 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 Diabetes mellitus without complication (HCC) 07/13/2017  . Acute pain of left knee 05/24/2018  . Dyslipidemia, goal LDL below 70 05/24/2019  . Depressed mood 06/24/2020  . GAD (generalized anxiety disorder) 06/24/2020   Resolved Ambulatory Problems    Diagnosis Date Noted  . Hyperglycemia 05/10/2015  . Type 2 diabetes mellitus (HCC) 07/24/2015  . Ankle fracture, left 12/03/2015   No Additional Past Medical History    Review of Systems     Objective:   Physical Exam Vitals reviewed.  Constitutional:      Appearance: Normal appearance.  Cardiovascular:     Rate and Rhythm:  Normal rate and regular rhythm.     Pulses: Normal pulses.     Heart sounds: Normal heart sounds.  Pulmonary:     Effort: Pulmonary effort is normal.     Breath sounds: Normal breath sounds.  Musculoskeletal:     Right lower leg: No edema.     Left lower leg: No edema.  Neurological:     General: No focal deficit present.     Mental Status: She is alert and oriented to person, place, and time.  Psychiatric:        Mood and Affect: Mood normal.       .. Depression screen [facility] 2/9 06/21/2020 03/08/2020 09/26/2019 03/24/2019 05/24/2018  Decreased Interest 2 0 0 0 0  Down, Depressed, Hopeless 2 1 0 1 0  PHQ - 2 Score 4 1 0 1 0  Altered sleeping 2 0 - 0 0  Tired, decreased energy 2 1 - 0 1  Change in appetite 0 0 - 0 0  Feeling bad or failure about yourself  1 1 - 0 0  Trouble concentrating 0 0 - 0 0  Moving slowly or fidgety/restless 0 0 - 0 0  Suicidal thoughts 0 0 - 0 0  PHQ-9 Score 9 3 - 1 1  Difficult doing work/chores Somewhat difficult Not difficult at all - Not difficult at all Not difficult at all   .Marland Kitchen GAD 7 : Generalized Anxiety Score 06/21/2020 03/08/2020 09/26/2019 03/24/2019  Nervous, Anxious, on  Edge 3 2 1 1   Control/stop worrying 1 0 0 0  Worry too much - different things 1 0 0 0  Trouble relaxing 1 0 0 0  Restless 1 0 0 0  Easily annoyed or irritable 3 1 1 1   Afraid - awful might happen 0 0 0 0  Total GAD 7 Score 10 3 2 2   Anxiety Difficulty Somewhat difficult Not difficult at all Not difficult at all Not difficult at all        Assessment & Plan:   Shauntea was seen today for diabetes.  Diagnoses and all orders for this visit:  Controlled type 2 diabetes mellitus with other specified complication, without long-term current use of insulin (HCC) -     POCT glycosylated hemoglobin (Hb A1C) -     Semaglutide, 1 MG/DOSE, (OZEMPIC, 1 MG/DOSE,) 2 MG/1.5ML SOPN; Inject 1 mg into the skin once a week.  Flu vaccine need -     Flu Vaccine QUAD 36+ mos  IM  Dyslipidemia, goal LDL below 70 -     atorvastatin (LIPITOR) 40 MG tablet; Take 1 tablet (40 mg total) by mouth daily.  Essential hypertension  GAD (generalized anxiety disorder) -     citalopram (CELEXA) 20 MG tablet; Take 1 tablet (20 mg total) by mouth daily.  Depressed mood -     citalopram (CELEXA) 20 MG tablet; Take 1 tablet (20 mg total) by mouth daily.   .. Lab Results  Component Value Date   HGBA1C 5.4 09/20/2020   a1C controlled.  Continue ozempic.  On statin. Labs UTD. BP to goal and a little low. Stop HCTZ. Continue ACE. If BP rising above 130/90 add 1/2 tablet of HCTZ back in.  Foot exam UTD.  Eye exam pt just had but need to get records at my eye doctor [place].  Flu/pneumonia/covid UTD.   Mood improved-refill of celexa sent.   Follow up 3 months.

## 2020-09-23 ENCOUNTER — Other Ambulatory Visit: Payer: Self-pay | Admitting: Physician Assistant

## 2020-09-23 DIAGNOSIS — H539 Unspecified visual disturbance: Secondary | ICD-10-CM | POA: Insufficient documentation

## 2020-09-23 DIAGNOSIS — Z1231 Encounter for screening mammogram for malignant neoplasm of breast: Secondary | ICD-10-CM

## 2020-09-25 ENCOUNTER — Ambulatory Visit (INDEPENDENT_AMBULATORY_CARE_PROVIDER_SITE_OTHER): Payer: 59

## 2020-09-25 ENCOUNTER — Other Ambulatory Visit: Payer: Self-pay

## 2020-09-25 DIAGNOSIS — Z1231 Encounter for screening mammogram for malignant neoplasm of breast: Secondary | ICD-10-CM

## 2020-09-27 ENCOUNTER — Other Ambulatory Visit: Payer: Self-pay | Admitting: Physician Assistant

## 2020-09-27 DIAGNOSIS — R928 Other abnormal and inconclusive findings on diagnostic imaging of breast: Secondary | ICD-10-CM

## 2020-09-27 NOTE — Progress Notes (Signed)
Left breast possible mass. Needs more imaging. They should be contacting you to schedule.

## 2020-10-09 ENCOUNTER — Ambulatory Visit
Admission: RE | Admit: 2020-10-09 | Discharge: 2020-10-09 | Disposition: A | Discharge status: Home / Self Care | Payer: 59 | Source: Ambulatory Visit | Attending: Physician Assistant | Admitting: Physician Assistant

## 2020-10-09 ENCOUNTER — Other Ambulatory Visit: Payer: Self-pay

## 2020-10-09 DIAGNOSIS — R928 Other abnormal and inconclusive findings on diagnostic imaging of breast: Secondary | ICD-10-CM

## 2020-10-09 NOTE — Progress Notes (Signed)
Normal mammogram. Follow up 1 year.

## 2020-10-10 ENCOUNTER — Encounter: Payer: Self-pay | Admitting: Physician Assistant

## 2020-10-10 DIAGNOSIS — R4589 Other symptoms and signs involving emotional state: Secondary | ICD-10-CM

## 2020-10-10 NOTE — Telephone Encounter (Signed)
Is there a referral for psychiatry for patient?

## 2020-10-14 NOTE — Telephone Encounter (Signed)
Was this something you were ordering?

## 2020-10-14 NOTE — Telephone Encounter (Signed)
Not sure what happened but I just placed referral.

## 2020-10-19 ENCOUNTER — Other Ambulatory Visit: Payer: Self-pay | Admitting: Physician Assistant

## 2020-10-19 DIAGNOSIS — R4589 Other symptoms and signs involving emotional state: Secondary | ICD-10-CM

## 2020-10-19 DIAGNOSIS — F411 Generalized anxiety disorder: Secondary | ICD-10-CM

## 2020-12-09 ENCOUNTER — Ambulatory Visit: Payer: 59 | Admitting: Professional

## 2020-12-20 ENCOUNTER — Other Ambulatory Visit: Payer: Self-pay

## 2020-12-20 ENCOUNTER — Encounter: Payer: Self-pay | Admitting: Physician Assistant

## 2020-12-20 ENCOUNTER — Ambulatory Visit (INDEPENDENT_AMBULATORY_CARE_PROVIDER_SITE_OTHER): Payer: 59 | Admitting: Physician Assistant

## 2020-12-20 VITALS — BP 123/66 | HR 81 | Ht 60.0 in | Wt 162.0 lb

## 2020-12-20 DIAGNOSIS — E1169 Type 2 diabetes mellitus with other specified complication: Secondary | ICD-10-CM | POA: Diagnosis not present

## 2020-12-20 DIAGNOSIS — I10 Essential (primary) hypertension: Secondary | ICD-10-CM

## 2020-12-20 DIAGNOSIS — E785 Hyperlipidemia, unspecified: Secondary | ICD-10-CM

## 2020-12-20 DIAGNOSIS — F411 Generalized anxiety disorder: Secondary | ICD-10-CM | POA: Diagnosis not present

## 2020-12-20 DIAGNOSIS — J3089 Other allergic rhinitis: Secondary | ICD-10-CM

## 2020-12-20 DIAGNOSIS — R4589 Other symptoms and signs involving emotional state: Secondary | ICD-10-CM

## 2020-12-20 LAB — POCT GLYCOSYLATED HEMOGLOBIN (HGB A1C): Hemoglobin A1C: 5.4 % (ref 4.0–5.6)

## 2020-12-20 MED ORDER — CITALOPRAM HYDROBROMIDE 20 MG PO TABS: 20.0000 mg | ORAL_TABLET | Freq: Every day | ORAL | 3 refills | Status: DC

## 2020-12-20 MED ORDER — ATORVASTATIN CALCIUM 40 MG PO TABS: 40.0000 mg | ORAL_TABLET | Freq: Every day | ORAL | 3 refills | Status: DC

## 2020-12-20 MED ORDER — TRULICITY 0.75 MG/0.5ML ~~LOC~~ SOAJ: 0.7500 mg | SUBCUTANEOUS | 2 refills | Status: DC

## 2020-12-20 MED ORDER — LISINOPRIL 10 MG PO TABS: 10.0000 mg | ORAL_TABLET | Freq: Every day | ORAL | 3 refills | Status: DC

## 2020-12-20 MED ORDER — LORATADINE 10 MG PO TABS: 10.0000 mg | ORAL_TABLET | Freq: Every day | ORAL | 3 refills | Status: DC

## 2020-12-20 NOTE — Progress Notes (Addendum)
Established Patient Office Visit  Subjective:  Patient ID: Connie Dyer, female    DOB: [birth year]  Age: 55 y.o. MRN: [id number]  CC:  Chief Complaint  Patient presents with  . Diabetes    HPI Connie Dyer presents for medication management and follow up for Diabetes, HTN, Anxiety, and Depression.  She states that today her only major concern is her change in insurance, which she was told no longer supports her Ozempic.  She currently estimates that she has around 1 month left of doses (1 full pen and 1 that she is currently using).  She wonders if there is a glitch somewhere in the system, or if there is anything we can do to make sure her insurance takes care of her diabetes medication.  She is satisfied with how her Celexa is helping her anxiety and depression.  She has also implemented stationary bike exercise on days when it is too cold to walk outside.  Additional, post her dental procedure she feels as though she has a new perspective on food, and eats what she needs and makes healthy choices.  She rarely enjoys a more unhealthy meal, maybe twice per week.    Her mood is great. No concerns. No SI/HC. Taking celexa daily.   Past Medical History:  Diagnosis Date  . Ankle fracture, left 12/03/2015  . Diabetes mellitus without complication ([facility]) 4/36/0677  . Essential hypertension 04/03/2015  . 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 History of colonic polyps 04/04/2015   2015: benign adenoma and sessile serrated polyp.   Marland Kitchen Hyperlipidemia 05/10/2015   AHA 10 year risk of 2.3% June 2016   . PTSD (post-traumatic stress disorder) 04/03/2015    Past Surgical History:  Procedure Laterality Date  . CHOLECYSTECTOMY      History reviewed. No pertinent family history.  Social History   Socioeconomic History  . Marital status: Married    Spouse name: Not on file  . Number of children: Not on file  . Years of education: Not on file  . Highest education  level: Not on file  Occupational History  . Not on file  Tobacco Use  . Smoking status: Never Smoker  . Smokeless tobacco: Never Used  Substance and Sexual Activity  . Alcohol use: Not on file  . Drug use: Not on file  . Sexual activity: Not on file  Other Topics Concern  . Not on file  Social History Narrative  . Not on file   Social Determinants of Health   Financial Resource Strain: Not on file  Food Insecurity: Not on file  Transportation Needs: Not on file  Physical Activity: Not on file  Stress: Not on file  Social Connections: Not on file  Intimate Partner Violence: Not on file    Outpatient Medications Prior to Visit  Medication Sig Dispense Refill  . AMBULATORY NON FORMULARY MEDICATION Glucometer device test strips and lancets needed for diabetes 250.00 to check sugars twice a day. 1 Device 0  . Blood Glucose Monitoring Suppl (ACCU-CHEK GUIDE) w/Device KIT 1 Device by Does not apply route daily. accu-chek test strips 100; 1 RF, accu-chek softclix lancets 100; 1 RF, check BID 1 kit 0  . Cholecalciferol (VITAMIN D3) 125 MCG (5000 UT) TABS Take by mouth daily.    . Semaglutide, 1 MG/DOSE, (OZEMPIC, 1 MG/DOSE,) 2 MG/1.5ML SOPN Inject 1 mg into the skin once a week. 18 mL 0  . atorvastatin (LIPITOR) 40  MG tablet Take 1 tablet (40 mg total) by mouth daily. 90 tablet 1  . citalopram (CELEXA) 20 MG tablet Take 1 tablet (20 mg total) by mouth daily. 90 tablet 0  . lisinopril (ZESTRIL) 10 MG tablet Take 1 tablet (10 mg total) by mouth daily. 90 tablet 1  . loratadine (CLARITIN) 10 MG tablet Take 10 mg by mouth daily.     No facility-administered medications prior to visit.    No Known Allergies  ROS Review of Systems  Constitutional: Positive for appetite change. Negative for activity change and fatigue.  HENT: Negative for congestion, rhinorrhea and sneezing.   Eyes: Negative for redness and itching.  Respiratory: Negative for cough, chest tightness and shortness of  breath.   Cardiovascular: Negative for chest pain and palpitations.  Gastrointestinal: Negative for abdominal pain, constipation, diarrhea, nausea and vomiting.  Endocrine: Negative for polydipsia and polyuria.  Genitourinary: Negative for dysuria, flank pain, frequency and urgency.  Musculoskeletal: Negative for arthralgias, back pain and neck pain.  Neurological: Negative for dizziness, syncope, weakness and headaches.  Psychiatric/Behavioral: Negative for agitation and dysphoric mood. The patient is not nervous/anxious.       Objective:    Physical Exam Constitutional:      General: She is not in acute distress.    Appearance: Normal appearance.  HENT:     Head: Normocephalic.  Eyes:     Extraocular Movements: Extraocular movements intact.     Pupils: Pupils are equal, round, and reactive to light.  Cardiovascular:     Rate and Rhythm: Normal rate and regular rhythm.     Pulses: Normal pulses.     Heart sounds: Normal heart sounds. No murmur heard. No friction rub. No gallop.   Pulmonary:     Effort: Pulmonary effort is normal.     Breath sounds: Normal breath sounds. No wheezing, rhonchi or rales.  Abdominal:     General: Abdomen is flat. There is no distension.  Musculoskeletal:        General: No swelling, tenderness or signs of injury.     Cervical back: Normal range of motion.  Skin:    General: Skin is warm and dry.  Neurological:     Mental Status: She is alert and oriented to person, place, and time. Mental status is at baseline.  Psychiatric:        Mood and Affect: Mood normal.        Behavior: Behavior normal.     BP 123/66   Pulse 81   Ht 5' (1.524 m)   Wt 162 lb (73.5 kg)   SpO2 100%   BMI 31.64 kg/m  Wt Readings from Last 3 Encounters:  12/20/20 162 lb (73.5 kg)  09/20/20 162 lb (73.5 kg)  06/21/20 170 lb (77.1 kg)     Health Maintenance Due  Topic Date Due  . PAP SMEAR-Modifier  05/09/2020     Lab Results  Component Value Date   WBC  7.7 10/19/2016   HGB 15.0 10/19/2016   HCT 45.6 (H) 10/19/2016   MCV 91.9 10/19/2016   PLT 265 10/19/2016   Lab Results  Component Value Date   NA 137 03/22/2020   K 4.6 03/22/2020   CO2 31 03/22/2020   GLUCOSE 131 (H) 03/22/2020   BUN 18 03/22/2020   CREATININE 0.88 03/22/2020   BILITOT 0.7 03/22/2020   ALKPHOS 19 (L) 10/19/2016   AST 13 03/22/2020   ALT 14 03/22/2020   PROT 7.1 03/22/2020   ALBUMIN  4.7 10/19/2016   CALCIUM 9.9 03/22/2020   Lab Results  Component Value Date   CHOL 232 (H) 03/22/2020   Lab Results  Component Value Date   HDL 67 03/22/2020   Lab Results  Component Value Date   LDLCALC 145 (H) 03/22/2020   Lab Results  Component Value Date   TRIG 96 03/22/2020   Lab Results  Component Value Date   CHOLHDL 3.5 03/22/2020   Lab Results  Component Value Date   HGBA1C 5.4 12/20/2020   .Marland Kitchen Depression screen [facility] 2/9 12/20/2020 06/21/2020 03/08/2020 09/26/2019 03/24/2019  Decreased Interest 0 2 0 0 0  Down, Depressed, Hopeless 0 2 1 0 1  PHQ - 2 Score 0 4 1 0 1  Altered sleeping 0 2 0 - 0  Tired, decreased energy 0 2 1 - 0  Change in appetite 0 0 0 - 0  Feeling bad or failure about yourself  0 1 1 - 0  Trouble concentrating 0 0 0 - 0  Moving slowly or fidgety/restless 0 0 0 - 0  Suicidal thoughts 0 0 0 - 0  PHQ-9 Score 0 9 3 - 1  Difficult doing work/chores Not difficult at all Somewhat difficult Not difficult at all - Not difficult at all   . GAD 7 : Generalized Anxiety Score 12/20/2020 06/21/2020 03/08/2020 09/26/2019  Nervous, Anxious, on Edge '1 3 2 1  ' Control/stop worrying 0 1 0 0  Worry too much - different things 0 1 0 0  Trouble relaxing 0 1 0 0  Restless 0 1 0 0  Easily annoyed or irritable '1 3 1 1  ' Afraid - awful might happen 0 0 0 0  Total GAD 7 Score '2 10 3 2  ' Anxiety Difficulty Not difficult at all Somewhat difficult Not difficult at all Not difficult at all       Assessment & Plan:  Marland KitchenMarland KitchenMessina was seen today for  diabetes.  Diagnoses and all orders for this visit:  Controlled type 2 diabetes mellitus with other specified complication, without long-term current use of insulin (HCC) -     POCT glycosylated hemoglobin (Hb A1C) -     COMPLETE METABOLIC PANEL WITH GFR -     Dulaglutide (TRULICITY) 9.48 AX/6.5VV SOPN; Inject 0.75 mg into the skin once a week.  GAD (generalized anxiety disorder) -     citalopram (CELEXA) 20 MG tablet; Take 1 tablet (20 mg total) by mouth daily.  Depressed mood -     citalopram (CELEXA) 20 MG tablet; Take 1 tablet (20 mg total) by mouth daily.  Dyslipidemia, goal LDL below 70 -     atorvastatin (LIPITOR) 40 MG tablet; Take 1 tablet (40 mg total) by mouth daily.  Essential hypertension -     lisinopril (ZESTRIL) 10 MG tablet; Take 1 tablet (10 mg total) by mouth daily. -     COMPLETE METABOLIC PANEL WITH GFR  Environmental and seasonal allergies -     loratadine (CLARITIN) 10 MG tablet; Take 1 tablet (10 mg total) by mouth daily.   A1C great.  Insurance will not pay for ozempic. Sent trulicity to try. Discussed how to use pen and same weekly dosing.  On ACE. BP to goal.  On STATIN. Not recheck lipid since start.  Foot exam UTD.  Eye exam UTD.  Covid/flu/pneumonia vaccine UTD.  Follow up in 3 months.   PHQ/GAD stable.  Refilled celexa.   NEEDS pap.      Follow-up: Return in about  3 months (around 03/20/2021).   Marland KitchenVernetta Honey PA-C, have reviewed and agree with the above documentation in it's entirety.

## 2021-01-10 LAB — COMPLETE METABOLIC PANEL WITH GFR
AG Ratio: 1.6 (calc) (ref 1.0–2.5)
ALT: 17 U/L (ref 6–29)
AST: 16 U/L (ref 10–35)
Albumin: 4.4 g/dL (ref 3.6–5.1)
Alkaline phosphatase (APISO): 18 U/L — ABNORMAL LOW (ref 37–153)
BUN: 15 mg/dL (ref 7–25)
CO2: 27 mmol/L (ref 20–32)
Calcium: 9.8 mg/dL (ref 8.6–10.4)
Chloride: 102 mmol/L (ref 98–110)
Creat: 0.89 mg/dL (ref 0.50–1.05)
GFR, Est African American: 84 mL/min/{1.73_m2} (ref >=60)
GFR, Est Non African American: 72 mL/min/{1.73_m2} (ref >=60)
Globulin: 2.7 g/dL (calc) (ref 1.9–3.7)
Glucose, Bld: 115 mg/dL — ABNORMAL HIGH (ref 65–99)
Potassium: 4.3 mmol/L (ref 3.5–5.3)
Sodium: 139 mmol/L (ref 135–146)
Total Bilirubin: 0.7 mg/dL (ref 0.2–1.2)
Total Protein: 7.1 g/dL (ref 6.1–8.1)

## 2021-01-10 NOTE — Progress Notes (Signed)
Lab looks good

## 2021-02-05 ENCOUNTER — Encounter: Payer: Self-pay | Admitting: Physician Assistant

## 2021-02-05 DIAGNOSIS — E1169 Type 2 diabetes mellitus with other specified complication: Secondary | ICD-10-CM

## 2021-02-06 MED ORDER — TRULICITY 0.75 MG/0.5ML ~~LOC~~ SOAJ: 0.7500 mg | SUBCUTANEOUS | 0 refills | Status: DC

## 2021-02-12 ENCOUNTER — Encounter: Payer: Self-pay | Admitting: Physician Assistant

## 2021-03-14 ENCOUNTER — Encounter: Payer: Self-pay | Admitting: Physician Assistant

## 2021-03-14 DIAGNOSIS — F411 Generalized anxiety disorder: Secondary | ICD-10-CM

## 2021-03-14 DIAGNOSIS — R4589 Other symptoms and signs involving emotional state: Secondary | ICD-10-CM

## 2021-03-28 ENCOUNTER — Encounter: Payer: Self-pay | Admitting: Physician Assistant

## 2021-03-28 ENCOUNTER — Ambulatory Visit (INDEPENDENT_AMBULATORY_CARE_PROVIDER_SITE_OTHER): Payer: 59 | Admitting: Physician Assistant

## 2021-03-28 ENCOUNTER — Other Ambulatory Visit: Payer: Self-pay

## 2021-03-28 VITALS — BP 127/63 | HR 82 | Ht 60.0 in | Wt 172.0 lb

## 2021-03-28 DIAGNOSIS — F411 Generalized anxiety disorder: Secondary | ICD-10-CM

## 2021-03-28 DIAGNOSIS — E1169 Type 2 diabetes mellitus with other specified complication: Secondary | ICD-10-CM

## 2021-03-28 DIAGNOSIS — L84 Corns and callosities: Secondary | ICD-10-CM | POA: Diagnosis not present

## 2021-03-28 DIAGNOSIS — I10 Essential (primary) hypertension: Secondary | ICD-10-CM

## 2021-03-28 DIAGNOSIS — E785 Hyperlipidemia, unspecified: Secondary | ICD-10-CM

## 2021-03-28 DIAGNOSIS — R4589 Other symptoms and signs involving emotional state: Secondary | ICD-10-CM

## 2021-03-28 LAB — POCT GLYCOSYLATED HEMOGLOBIN (HGB A1C): Hemoglobin A1C: 5.8 % — AB (ref 4.0–5.6)

## 2021-03-28 MED ORDER — CITALOPRAM HYDROBROMIDE 40 MG PO TABS: 40.0000 mg | ORAL_TABLET | Freq: Every day | ORAL | 3 refills | Status: DC

## 2021-03-28 MED ORDER — TRULICITY 0.75 MG/0.5ML ~~LOC~~ SOAJ: 0.7500 mg | SUBCUTANEOUS | 0 refills | Status: DC

## 2021-03-28 NOTE — Progress Notes (Signed)
Subjective:    Patient ID: Connie Dyer, female    DOB: 11/26/1966, 55 y.o.   MRN: [id number]  HPI  Pt is a 55 yo female with T2DM, HTN, dyslipidemia who presents to the clinic for 3 month follow up.   She is doing really well. She is not checking sugars. She did have to swtich from ozempic to trulicity due to insurance last visit. She has done ok with transition. No open sores or wounds. No hypoglycemic events. Denies any CP, palpitations, headaches or vision changes.   She does have a hard spot on bottom of left foot. No pain. Not done anything to make better.   .. Active Ambulatory Problems    Diagnosis Date Noted  . Essential hypertension 04/03/2015  . PTSD (post-traumatic stress disorder) 04/03/2015  . History of colonic polyps 04/04/2015  . Hyperlipidemia 05/10/2015  . 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 Diabetes mellitus without complication (HCC) 07/13/2017  . Acute pain of left knee 05/24/2018  . Dyslipidemia, goal LDL below 70 05/24/2019  . Depressed mood 06/24/2020  . GAD (generalized anxiety disorder) 06/24/2020  . Vision changes 09/23/2020  . Corn of foot 03/28/2021   Resolved Ambulatory Problems    Diagnosis Date Noted  . Hyperglycemia 05/10/2015  . Type 2 diabetes mellitus (HCC) 07/24/2015  . Ankle fracture, left 12/03/2015   No Additional Past Medical History     Review of Systems  All other systems reviewed and are negative.      Objective:   Physical Exam Vitals reviewed.  Constitutional:      Appearance: Normal appearance.  Cardiovascular:     Rate and Rhythm: Normal rate and regular rhythm.     Pulses: Normal pulses.  Pulmonary:     Effort: Pulmonary effort is normal.     Breath sounds: Normal breath sounds.  Musculoskeletal:     Right lower leg: No edema.     Left lower leg: No edema.  Neurological:     General: No focal deficit present.     Mental Status: She is alert and oriented to person, place, and time.  Psychiatric:         Mood and Affect: Mood normal.    .. Lab Results  Component Value Date   HGBA1C 5.8 (A) 03/28/2021          Assessment & Plan:  Marland KitchenMarland KitchenJadalee was seen today for diabetes.  Diagnoses and all orders for this visit:  Controlled type 2 diabetes mellitus with other specified complication, without long-term current use of insulin (HCC) -     POCT glycosylated hemoglobin (Hb A1C) -     COMPLETE METABOLIC PANEL WITH GFR -     Dulaglutide (TRULICITY) 0.75 MG/0.5ML SOPN; Inject 0.75 mg into the skin once a week.  Dyslipidemia, goal LDL below 70 -     Lipid Panel w/reflex Direct LDL  Essential hypertension -     COMPLETE METABOLIC PANEL WITH GFR  Corn of foot  GAD (generalized anxiety disorder) -     citalopram (CELEXA) 40 MG tablet; Take 1 tablet (40 mg total) by mouth daily.  Depressed mood -     citalopram (CELEXA) 40 MG tablet; Take 1 tablet (40 mg total) by mouth daily.   A1C to goal.  Continue on same medications.  BP to goal. On ACE.  On STATIN.  .. Diabetic Foot Exam - Simple   Simple Foot Form Visual Inspection No deformities, no ulcerations, no other skin breakdown bilaterally: Yes See  comments: Yes Sensation Testing Intact to touch and monofilament testing bilaterally: Yes Pulse Check Posterior Tibialis and Dorsalis pulse intact bilaterally: Yes Comments One small area on bottom of left foot, not painful, patient wants doctor to look at    Covid/pnemonia/flu vaccine UTD.  Eye exam UTD.  Follow up in 3 months.   PHQ/GAD controlled. Refilled celexa.   Looks like corn of foot. Use corn pad. epson salt and pummel stone.

## 2021-04-09 ENCOUNTER — Encounter: Payer: Self-pay | Admitting: Physician Assistant

## 2021-04-11 ENCOUNTER — Ambulatory Visit (INDEPENDENT_AMBULATORY_CARE_PROVIDER_SITE_OTHER): Payer: 59 | Admitting: Physician Assistant

## 2021-04-11 ENCOUNTER — Other Ambulatory Visit: Payer: Self-pay

## 2021-04-11 ENCOUNTER — Encounter: Payer: Self-pay | Admitting: Physician Assistant

## 2021-04-11 ENCOUNTER — Other Ambulatory Visit (HOSPITAL_COMMUNITY)
Admission: RE | Admit: 2021-04-11 | Discharge: 2021-04-11 | Disposition: A | Discharge status: Home / Self Care | Payer: 59 | Source: Ambulatory Visit | Attending: Physician Assistant | Admitting: Physician Assistant

## 2021-04-11 VITALS — BP 109/59 | HR 87 | Ht 60.0 in | Wt 172.0 lb

## 2021-04-11 DIAGNOSIS — Z124 Encounter for screening for malignant neoplasm of cervix: Secondary | ICD-10-CM | POA: Diagnosis not present

## 2021-04-11 NOTE — Patient Instructions (Signed)
Health Maintenance, Female Adopting a healthy lifestyle and getting preventive care are important in promoting health and wellness. Ask your health care provider about:  The right schedule for you to have regular tests and exams.  Things you can do on your own to prevent diseases and keep yourself healthy. What should I know about diet, weight, and exercise? Eat a healthy diet  Eat a diet that includes plenty of vegetables, fruits, low-fat dairy products, and lean protein.  Do not eat a lot of foods that are high in solid fats, added sugars, or sodium.   Maintain a healthy weight Body mass index (BMI) is used to identify weight problems. It estimates body fat based on height and weight. Your health care provider can help determine your BMI and help you achieve or maintain a healthy weight. Get regular exercise Get regular exercise. This is one of the most important things you can do for your health. Most adults should:  Exercise for at least 150 minutes each week. The exercise should increase your heart rate and make you sweat (moderate-intensity exercise).  Do strengthening exercises at least twice a week. This is in addition to the moderate-intensity exercise.  Spend less time sitting. Even light physical activity can be beneficial. Watch cholesterol and blood lipids Have your blood tested for lipids and cholesterol at 57 years of age, then have this test every 5 years. Have your cholesterol levels checked more often if:  Your lipid or cholesterol levels are high.  You are older than 57 years of age.  You are at high risk for heart disease. What should I know about cancer screening? Depending on your health history and family history, you may need to have cancer screening at various ages. This may include screening for:  Breast cancer.  Cervical cancer.  Colorectal cancer.  Skin cancer.  Lung cancer. What should I know about heart disease, diabetes, and high blood  pressure? Blood pressure and heart disease  High blood pressure causes heart disease and increases the risk of stroke. This is more likely to develop in people who have high blood pressure readings, are of African descent, or are overweight.  Have your blood pressure checked: ? Every 3-5 years if you are 18-39 years of age. ? Every year if you are 40 years old or older. Diabetes Have regular diabetes screenings. This checks your fasting blood sugar level. Have the screening done:  Once every three years after age 40 if you are at a normal weight and have a low risk for diabetes.  More often and at a younger age if you are overweight or have a high risk for diabetes. What should I know about preventing infection? Hepatitis B If you have a higher risk for hepatitis B, you should be screened for this virus. Talk with your health care provider to find out if you are at risk for hepatitis B infection. Hepatitis C Testing is recommended for:  Everyone born from 1945 through 1965.  Anyone with known risk factors for hepatitis C. Sexually transmitted infections (STIs)  Get screened for STIs, including gonorrhea and chlamydia, if: ? You are sexually active and are younger than 57 years of age. ? You are older than 57 years of age and your health care provider tells you that you are at risk for this type of infection. ? Your sexual activity has changed since you were last screened, and you are at increased risk for chlamydia or gonorrhea. Ask your health care provider   if you are at risk.  Ask your health care provider about whether you are at high risk for HIV. Your health care provider may recommend a prescription medicine to help prevent HIV infection. If you choose to take medicine to prevent HIV, you should first get tested for HIV. You should then be tested every 3 months for as long as you are taking the medicine. Pregnancy  If you are about to stop having your period (premenopausal) and  you may become pregnant, seek counseling before you get pregnant.  Take 400 to 800 micrograms (mcg) of folic acid every day if you become pregnant.  Ask for birth control (contraception) if you want to prevent pregnancy. Osteoporosis and menopause Osteoporosis is a disease in which the bones lose minerals and strength with aging. This can result in bone fractures. If you are 65 years old or older, or if you are at risk for osteoporosis and fractures, ask your health care provider if you should:  Be screened for bone loss.  Take a calcium or vitamin D supplement to lower your risk of fractures.  Be given hormone replacement therapy (HRT) to treat symptoms of menopause. Follow these instructions at home: Lifestyle  Do not use any products that contain nicotine or tobacco, such as cigarettes, e-cigarettes, and chewing tobacco. If you need help quitting, ask your health care provider.  Do not use street drugs.  Do not share needles.  Ask your health care provider for help if you need support or information about quitting drugs. Alcohol use  Do not drink alcohol if: ? Your health care provider tells you not to drink. ? You are pregnant, may be pregnant, or are planning to become pregnant.  If you drink alcohol: ? Limit how much you use to 0-1 drink a day. ? Limit intake if you are breastfeeding.  Be aware of how much alcohol is in your drink. In the [place] one drink equals one 12 oz bottle of beer (355 mL), one 5 oz glass of wine (148 mL), or one 1 oz glass of hard liquor (44 mL). General instructions  Schedule regular health, dental, and eye exams.  Stay current with your vaccines.  Tell your health care provider if: ? You often feel depressed. ? You have ever been abused or do not feel safe at home. Summary  Adopting a healthy lifestyle and getting preventive care are important in promoting health and wellness.  Follow your health care provider's instructions about healthy  diet, exercising, and getting tested or screened for diseases.  Follow your health care provider's instructions on monitoring your cholesterol and blood pressure. This information is not intended to replace advice given to you by your health care provider. Make sure you discuss any questions you have with your health care provider. Document Revised: 11/09/2018 Document Reviewed: 11/09/2018 Elsevier Patient Education  2021 Elsevier Inc.  

## 2021-04-11 NOTE — Progress Notes (Signed)
Subjective:     Connie Dyer is a 55 y.o. woman who comes in today for a  pap smear only. Her most recent annual exam was on 03/28/2021. Her most recent Pap smear was on 05/10/2015 and showed no abnormalities. Previous abnormal Pap smears: no. Contraception: none  The following portions of the patient's history were reviewed and updated as appropriate: allergies, current medications, past family history, past medical history, past social history, past surgical history and problem list.  Review of Systems A comprehensive review of systems was negative.   Objective:    BP (!) 109/59   Pulse 87   Ht 5' (1.524 m)   Wt 172 lb (78 kg)   SpO2 99%   BMI 33.59 kg/m  Pelvic Exam: cervix normal in appearance, external genitalia normal and vagina normal without discharge. Pap smear obtained.   Assessment:    Screening pap smear.   Plan:    Follow up in 5 years, or as indicated by Pap results.

## 2021-04-17 LAB — CYTOLOGY - PAP
Comment: NEGATIVE
Diagnosis: NEGATIVE
High risk HPV: NEGATIVE

## 2021-04-18 NOTE — Progress Notes (Signed)
No abnormal cells. Negative for HPV. Next pap 5 years.

## 2021-04-22 ENCOUNTER — Encounter: Payer: Self-pay | Admitting: Physician Assistant

## 2021-04-29 MED ORDER — OZEMPIC (1 MG/DOSE) 2 MG/1.5ML ~~LOC~~ SOPN: 1.0000 mg | PEN_INJECTOR | SUBCUTANEOUS | 2 refills | Status: DC

## 2021-05-01 ENCOUNTER — Encounter: Payer: Self-pay | Admitting: Physician Assistant

## 2021-05-02 MED ORDER — OZEMPIC (1 MG/DOSE) 2 MG/1.5ML ~~LOC~~ SOPN: 1.0000 mg | PEN_INJECTOR | SUBCUTANEOUS | 0 refills | Status: DC

## 2021-05-06 ENCOUNTER — Other Ambulatory Visit: Payer: Self-pay | Admitting: *Deleted

## 2021-05-18 ENCOUNTER — Encounter: Payer: Self-pay | Admitting: Physician Assistant

## 2021-05-22 ENCOUNTER — Other Ambulatory Visit: Payer: Self-pay | Admitting: Physician Assistant

## 2021-06-24 LAB — LIPID PANEL W/REFLEX DIRECT LDL
Cholesterol: 151 mg/dL (ref <200)
HDL: 68 mg/dL (ref >=50)
LDL Cholesterol (Calc): 70 mg/dL (calc)
Non-HDL Cholesterol (Calc): 83 mg/dL (calc) (ref <130)
Total CHOL/HDL Ratio: 2.2 (calc) (ref <5.0)
Triglycerides: 51 mg/dL (ref <150)

## 2021-06-24 LAB — COMPLETE METABOLIC PANEL WITH GFR
AG Ratio: 1.6 (calc) (ref 1.0–2.5)
ALT: 14 U/L (ref 6–29)
AST: 15 U/L (ref 10–35)
Albumin: 4.5 g/dL (ref 3.6–5.1)
Alkaline phosphatase (APISO): 21 U/L — ABNORMAL LOW (ref 37–153)
BUN: 14 mg/dL (ref 7–25)
CO2: 29 mmol/L (ref 20–32)
Calcium: 9.6 mg/dL (ref 8.6–10.4)
Chloride: 100 mmol/L (ref 98–110)
Creat: 0.81 mg/dL (ref 0.50–1.03)
Globulin: 2.9 g/dL (calc) (ref 1.9–3.7)
Glucose, Bld: 127 mg/dL — ABNORMAL HIGH (ref 65–99)
Potassium: 4.7 mmol/L (ref 3.5–5.3)
Sodium: 136 mmol/L (ref 135–146)
Total Bilirubin: 0.7 mg/dL (ref 0.2–1.2)
Total Protein: 7.4 g/dL (ref 6.1–8.1)
eGFR: 85 mL/min/{1.73_m2} (ref >=60)

## 2021-06-25 ENCOUNTER — Encounter: Payer: Self-pay | Admitting: Physician Assistant

## 2021-06-25 NOTE — Progress Notes (Signed)
Connie Dyer,   Your cholesterol looks so MUCH BETTER than 1 year ago.  Kidney and liver look good.

## 2021-06-27 ENCOUNTER — Other Ambulatory Visit: Payer: Self-pay

## 2021-06-27 ENCOUNTER — Encounter: Payer: Self-pay | Admitting: Physician Assistant

## 2021-06-27 ENCOUNTER — Ambulatory Visit (INDEPENDENT_AMBULATORY_CARE_PROVIDER_SITE_OTHER): Payer: 59 | Admitting: Physician Assistant

## 2021-06-27 VITALS — BP 111/62 | HR 76 | Ht 60.0 in | Wt 178.0 lb

## 2021-06-27 DIAGNOSIS — I10 Essential (primary) hypertension: Secondary | ICD-10-CM | POA: Diagnosis not present

## 2021-06-27 DIAGNOSIS — E1169 Type 2 diabetes mellitus with other specified complication: Secondary | ICD-10-CM

## 2021-06-27 DIAGNOSIS — E785 Hyperlipidemia, unspecified: Secondary | ICD-10-CM

## 2021-06-27 LAB — POCT GLYCOSYLATED HEMOGLOBIN (HGB A1C): Hemoglobin A1C: 5.6 % (ref 4.0–5.6)

## 2021-06-27 MED ORDER — OZEMPIC (1 MG/DOSE) 4 MG/3ML ~~LOC~~ SOPN: PEN_INJECTOR | SUBCUTANEOUS | 1 refills | Status: DC

## 2021-06-27 NOTE — Progress Notes (Signed)
   Subjective:    Patient ID: Connie Dyer, female    DOB: [birth year], 55 y.o.   MRN: [id number]  HPI Pt is a 55 yo female with type 2 diabetes, hypertension, hyperlipidemia who presents to the clinic for 71-month follow-up.  Patient is taking only Ozempic.  She is not checking her sugars.  She is trying to stay active and watch her sugars and carbs.  She denies any hypoglycemic events, open sores or wounds.  She denies any chest pain, palpitations, headaches or vision changes.  Patient is very compliant with all her medications and overall feeling great.  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/15/2015   Diabetes mellitus type II, controlled (HCC) 07/13/2017   Acute pain of left knee 05/24/2018   Dyslipidemia, goal LDL below 70 05/24/2019   Depressed mood 06/24/2020   GAD (generalized anxiety disorder) 06/24/2020   Vision changes 09/23/2020   Corn of foot 03/28/2021   Resolved Ambulatory Problems    Diagnosis Date Noted   Hyperglycemia 05/10/2015   Type 2 diabetes mellitus (HCC) 07/24/2015   Ankle fracture, left 12/03/2015   Past Medical History:  Diagnosis Date   Diabetes mellitus without complication (HCC) 07/13/2017     Review of Systems  All other systems reviewed and are negative.     Objective:   Physical Exam Vitals reviewed.  Constitutional:      Appearance: Normal appearance.  HENT:     Head: Normocephalic.  Cardiovascular:     Rate and Rhythm: Normal rate.     Pulses: Normal pulses.  Pulmonary:     Effort: Pulmonary effort is normal.     Breath sounds: Normal breath sounds.  Musculoskeletal:     Right lower leg: No edema.     Left lower leg: No edema.  Neurological:     General: No focal deficit present.     Mental Status: She is alert and oriented to  person, place, and time.  Psychiatric:        Mood and Affect: Mood normal.      .. Results for orders placed or performed in visit on 06/27/21  POCT glycosylated hemoglobin (Hb A1C)  Result Value Ref Range   Hemoglobin A1C 5.6 4.0 - 5.6 %   HbA1c POC (<> result, manual entry)     HbA1c, POC (prediabetic range)     HbA1c, POC (controlled diabetic range)          Assessment & Plan:  Marland KitchenMarland KitchenMaidie was seen today for diabetes.  Diagnoses and all orders for this visit:  Controlled type 2 diabetes mellitus with other specified complication, without long-term current use of insulin (HCC) -     POCT glycosylated hemoglobin (Hb A1C) -     Semaglutide, 1 MG/DOSE, (OZEMPIC, 1 MG/DOSE,) 4 MG/3ML SOPN; INJECT 1 MG INTO SKIN ONCE A WEEK  Essential hypertension  Dyslipidemia, goal LDL below 70  A1C looks great to goal.  Continue on ozempic.  BP to goal. Continue on lisinopril.  On statin.  UTD foot exam and eye exam.  Covid and pneumonia UTD.  Follow up in 6 months.

## 2021-06-30 ENCOUNTER — Encounter: Payer: Self-pay | Admitting: Physician Assistant

## 2021-07-20 IMAGING — MG DIGITAL SCREENING BILAT W/ TOMO W/ CAD
6 of 10 series · 6 of 30 positions shown · non-contrast
Comparison: Previous exam(s).

CLINICAL DATA: Screening.

EXAM:
DIGITAL SCREENING BILATERAL MAMMOGRAM WITH TOMO AND CAD

[R CC synth-2D]
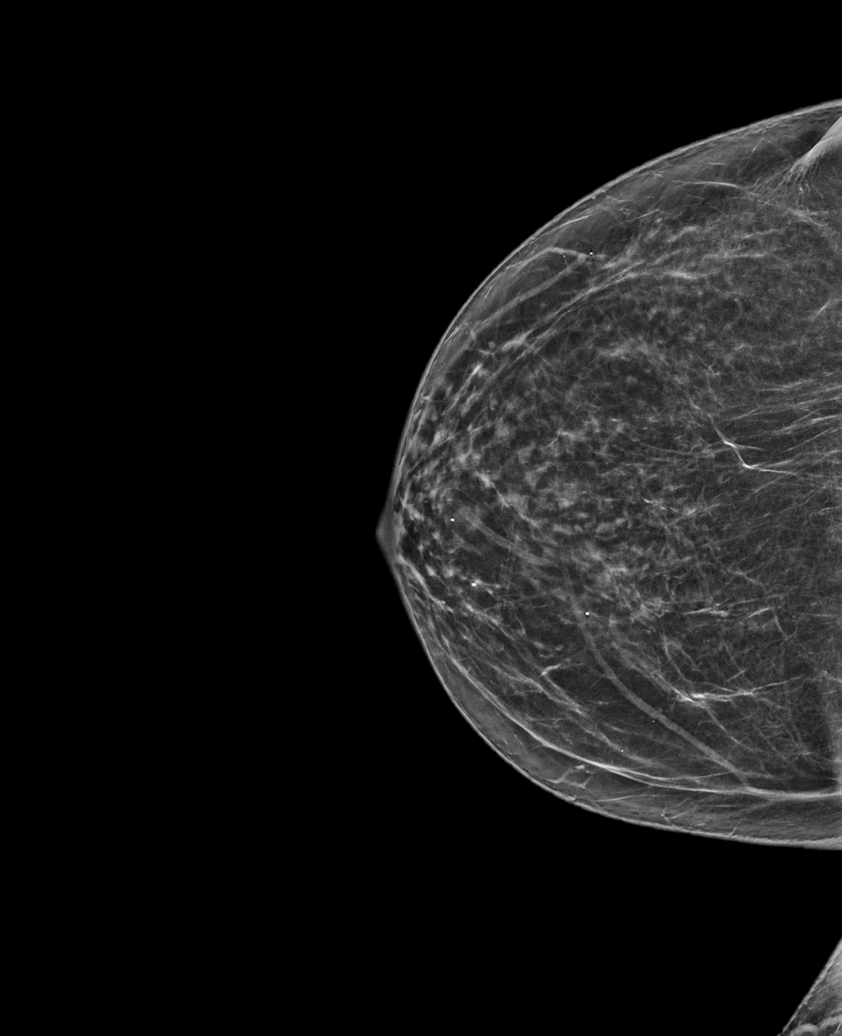

[R MLO synth-2D]
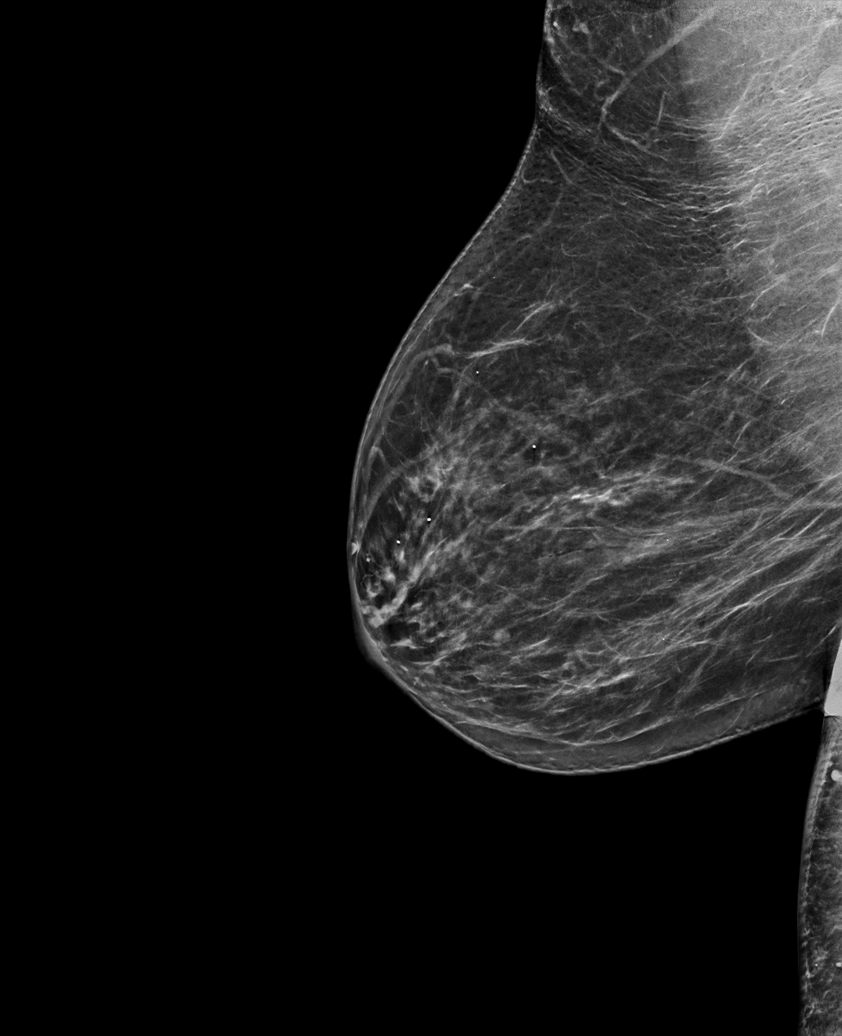

[L CC synth-2D]
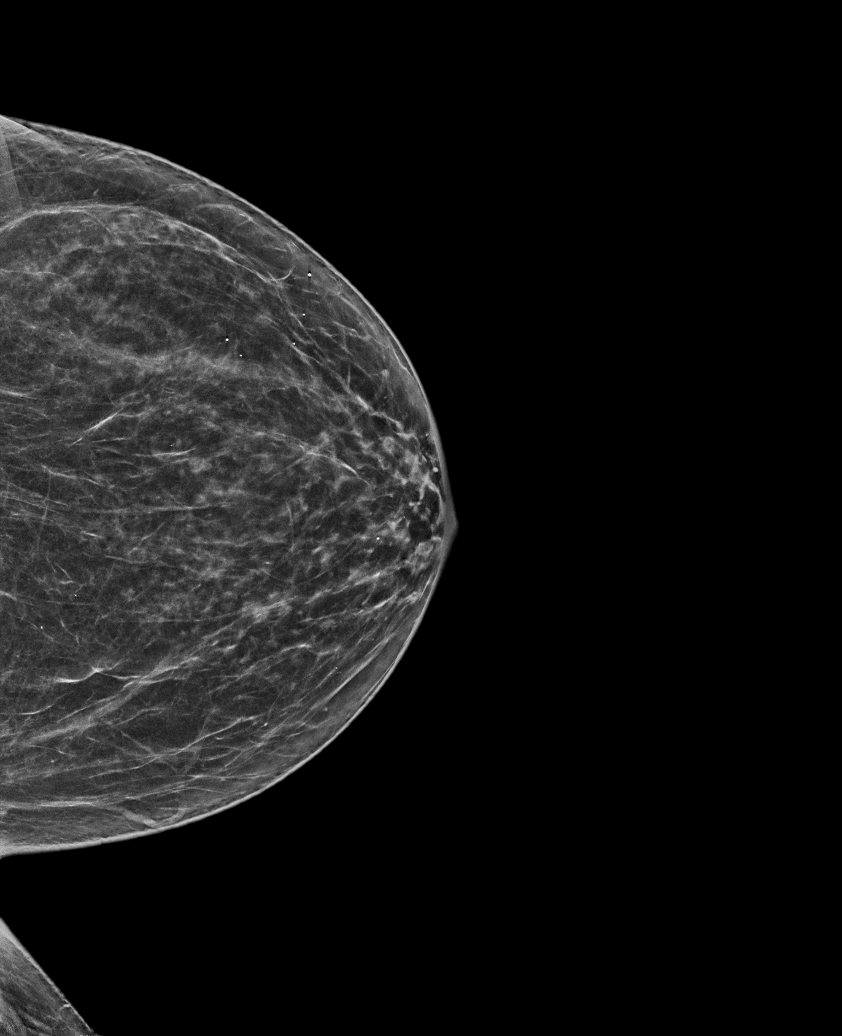

[L MLO synth-2D]
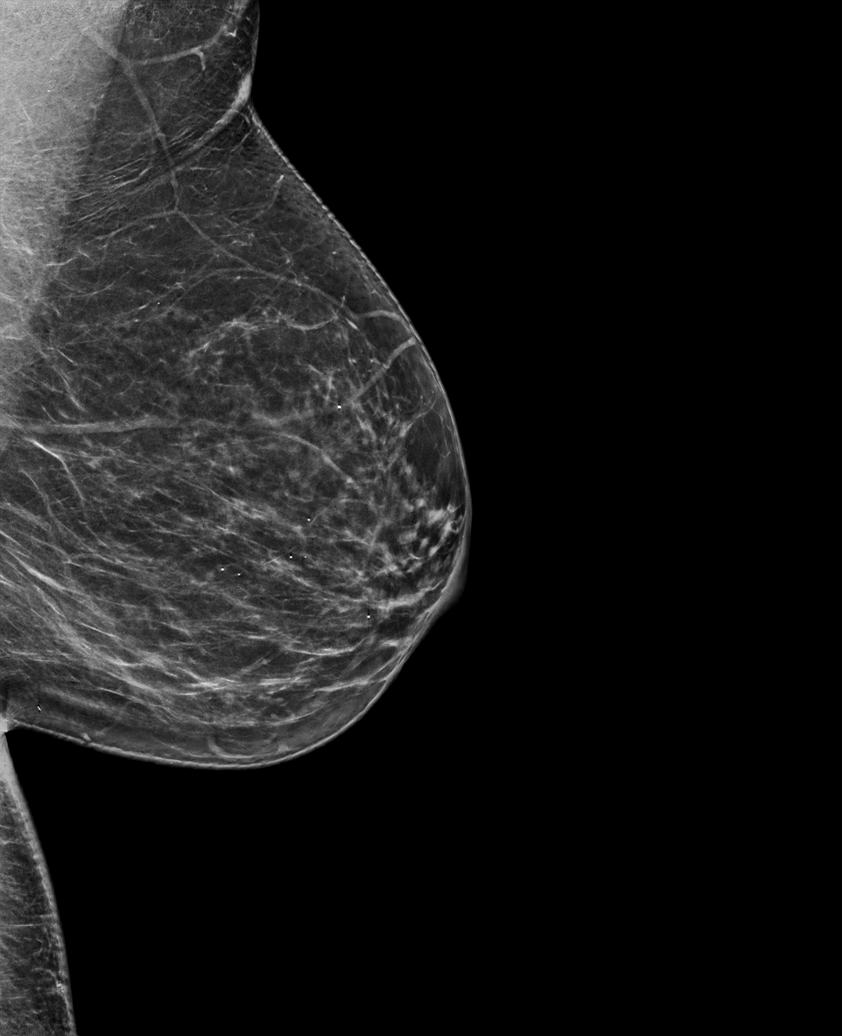

[L XCCL synth-2D]
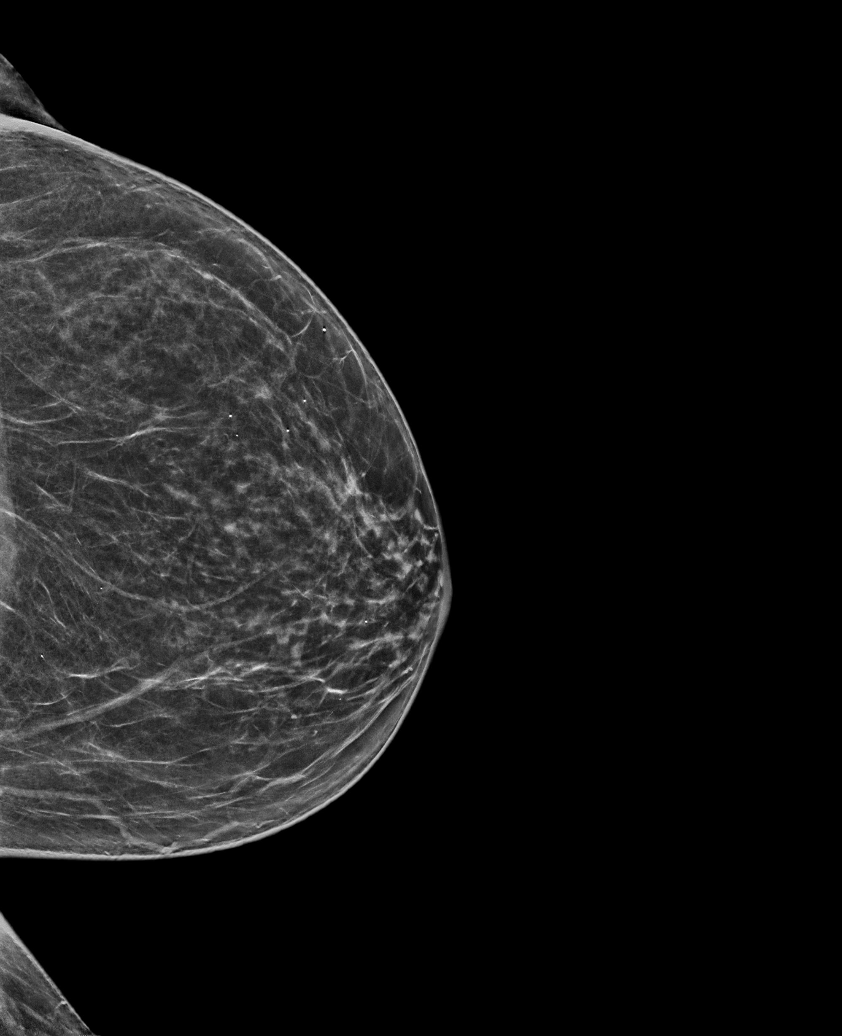

[R CC tomo · tomo slice 31/62.0]
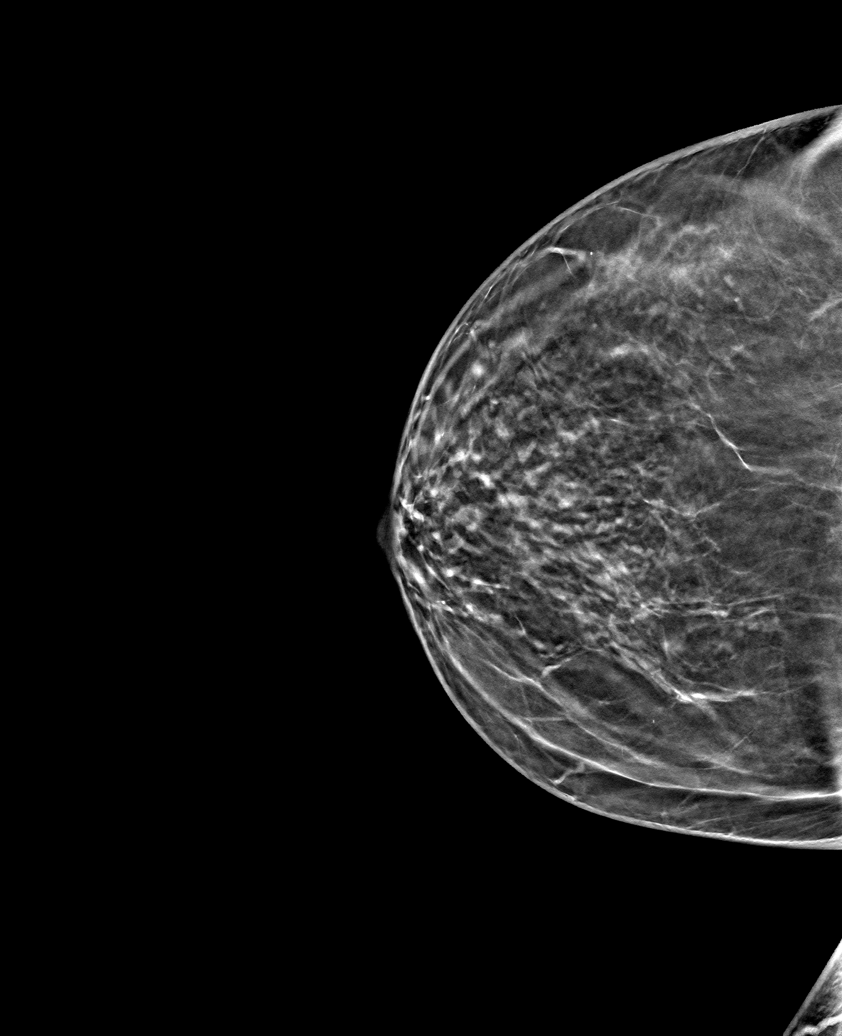

[6 of 30 positions shown; findings below may reference images not displayed]

ACR Breast Density Category b: There are scattered areas of
fibroglandular density.
FINDINGS: In the left breast, a possible mass with distortion mass warrants
further evaluation. This possible mass with distortion is seen
within the outer LEFT breast on the XCCL view only, slice 28.

In the right breast, no findings suspicious for malignancy.

Images were processed with CAD.
IMPRESSION: Further evaluation is suggested for a possible small mass with
distortion in the outer left breast.

RECOMMENDATION:
Diagnostic mammogram and possibly ultrasound of the left breast.
(Code:[id number])

The patient will be contacted regarding the findings, and additional
imaging will be scheduled.

BI-RADS CATEGORY  0: Incomplete. Need additional imaging evaluation
and/or prior mammograms for comparison.

## 2021-08-25 ENCOUNTER — Encounter: Payer: Self-pay | Admitting: Physician Assistant

## 2021-08-31 ENCOUNTER — Encounter: Payer: Self-pay | Admitting: Physician Assistant

## 2021-09-15 ENCOUNTER — Encounter: Payer: Self-pay | Admitting: Physician Assistant

## 2021-09-15 ENCOUNTER — Telehealth (INDEPENDENT_AMBULATORY_CARE_PROVIDER_SITE_OTHER): Payer: 59 | Admitting: Physician Assistant

## 2021-09-15 VITALS — BP 121/72 | Wt 176.0 lb

## 2021-09-15 DIAGNOSIS — F411 Generalized anxiety disorder: Secondary | ICD-10-CM

## 2021-09-15 DIAGNOSIS — F410 Panic disorder [episodic paroxysmal anxiety] without agoraphobia: Secondary | ICD-10-CM | POA: Diagnosis not present

## 2021-09-15 MED ORDER — ALPRAZOLAM 0.5 MG PO TABS: 0.5000 mg | ORAL_TABLET | Freq: Two times a day (BID) | ORAL | 1 refills | Status: DC | PRN

## 2021-09-15 NOTE — Progress Notes (Signed)
..Virtual Visit via Video Note  I connected with Connie Dyer on 09/16/21 at  4:00 PM EDT by a video enabled telemedicine application and verified that I am speaking with the correct person using two identifiers.  Location: Patient: home Provider: clinic  .Marland KitchenParticipating in visit:  Patient: Connie Dyer Provider: Tandy Gaw PA-C   I discussed the limitations of evaluation and management by telemedicine and the availability of in person appointments. The patient expressed understanding and agreed to proceed.  History of Present Illness: Pt is a 55 yo female with GAD who calls into the clinic to discuss panic attacks. She has only had one full blown panic attack in last month but felt like she was on the verge of at least 2-3 a week. She is working with counselor and thought it might be needed to have a rescue. She has used xanax before and helped a lot. She is taking celexa daily. No SI/HC.  Marland Kitchen. 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/15/2015   Diabetes mellitus type II, controlled (HCC) 07/13/2017   Acute pain of left knee 05/24/2018   Dyslipidemia, goal LDL below 70 05/24/2019   Depressed mood 06/24/2020   GAD (generalized anxiety disorder) 06/24/2020   Vision changes 09/23/2020   Corn of foot 03/28/2021   Panic attack 09/15/2021   Resolved Ambulatory Problems    Diagnosis Date Noted   Hyperglycemia 05/10/2015   Type 2 diabetes mellitus (HCC) 07/24/2015   Ankle fracture, left 12/03/2015   Past Medical History:  Diagnosis Date   Diabetes mellitus without complication (HCC) 07/13/2017    Observations/Objective: No acute distress Normal mood and appearance No labored breathing  .Marland Kitchen Today's Vitals   09/15/21 1546  BP: 121/72  Weight: 176 lb (79.8 kg)   Body mass index is 34.37  kg/m. .. GAD 7 : Generalized Anxiety Score 09/15/2021 12/20/2020 06/21/2020 03/08/2020  Nervous, Anxious, on Edge 2 1 3 2   Control/stop worrying 1 0 1 0  Worry too much - different things 1 0 1 0  Trouble relaxing 1 0 1 0  Restless 0 0 1 0  Easily annoyed or irritable 1 1 3 1   Afraid - awful might happen 0 0 0 0  Total GAD 7 Score 6 2 10 3   Anxiety Difficulty Not difficult at all Not difficult at all Somewhat difficult Not difficult at all       Assessment and Plan:  Shonte was seen today for new med request.  Diagnoses and all orders for this visit:  GAD (generalized anxiety disorder)  Panic attack -     ALPRAZolam (XANAX) 0.5 MG tablet; Take 1 tablet (0.5 mg total) by mouth 2 (two) times daily as needed for anxiety.  Continue on celexa for GAD. Added xanax for rescue as needed. Discussed risk for dependency and worsening or anxiety with overuse. Use sparingly. If finding needing more and more need to consider daily medication changes. Continue to work with counseling. Follow up in 3 months.    Xanax as needed Follow Up Instructions:    I discussed the assessment and treatment plan with the patient. The patient was provided an opportunity to ask questions and all were answered. The patient agreed with the plan and demonstrated an understanding of the instructions.   The patient was advised to call back or seek an  in-person evaluation if the symptoms worsen or if the condition fails to improve as anticipated.    Tandy Gaw, PA-C

## 2021-10-27 ENCOUNTER — Encounter: Payer: Self-pay | Admitting: Physician Assistant

## 2021-11-12 ENCOUNTER — Other Ambulatory Visit: Payer: Self-pay | Admitting: Neurology

## 2021-11-12 DIAGNOSIS — I10 Essential (primary) hypertension: Secondary | ICD-10-CM

## 2021-11-12 MED ORDER — LISINOPRIL 10 MG PO TABS: 10.0000 mg | ORAL_TABLET | Freq: Every day | ORAL | 0 refills | Status: DC

## 2021-12-26 ENCOUNTER — Ambulatory Visit: Payer: 59 | Admitting: Physician Assistant

## 2022-01-16 ENCOUNTER — Other Ambulatory Visit: Payer: Self-pay

## 2022-01-16 ENCOUNTER — Encounter: Payer: Self-pay | Admitting: Physician Assistant

## 2022-01-16 ENCOUNTER — Ambulatory Visit (INDEPENDENT_AMBULATORY_CARE_PROVIDER_SITE_OTHER): Payer: BC Managed Care – PPO | Admitting: Physician Assistant

## 2022-01-16 VITALS — BP 105/70 | HR 87 | Ht 60.0 in | Wt 174.0 lb

## 2022-01-16 DIAGNOSIS — R4589 Other symptoms and signs involving emotional state: Secondary | ICD-10-CM

## 2022-01-16 DIAGNOSIS — Z23 Encounter for immunization: Secondary | ICD-10-CM | POA: Diagnosis not present

## 2022-01-16 DIAGNOSIS — J3089 Other allergic rhinitis: Secondary | ICD-10-CM | POA: Diagnosis not present

## 2022-01-16 DIAGNOSIS — F411 Generalized anxiety disorder: Secondary | ICD-10-CM | POA: Diagnosis not present

## 2022-01-16 DIAGNOSIS — E1169 Type 2 diabetes mellitus with other specified complication: Secondary | ICD-10-CM

## 2022-01-16 DIAGNOSIS — I10 Essential (primary) hypertension: Secondary | ICD-10-CM

## 2022-01-16 DIAGNOSIS — E785 Hyperlipidemia, unspecified: Secondary | ICD-10-CM

## 2022-01-16 LAB — POCT GLYCOSYLATED HEMOGLOBIN (HGB A1C): Hemoglobin A1C: 5.7 % — AB (ref 4.0–5.6)

## 2022-01-16 MED ORDER — CITALOPRAM HYDROBROMIDE 40 MG PO TABS: 40.0000 mg | ORAL_TABLET | Freq: Every day | ORAL | 3 refills | Status: DC

## 2022-01-16 MED ORDER — LORATADINE 10 MG PO TABS: 10.0000 mg | ORAL_TABLET | Freq: Every day | ORAL | 3 refills | Status: DC

## 2022-01-16 MED ORDER — OZEMPIC (1 MG/DOSE) 4 MG/3ML ~~LOC~~ SOPN: PEN_INJECTOR | SUBCUTANEOUS | 1 refills | Status: DC

## 2022-01-16 MED ORDER — ATORVASTATIN CALCIUM 40 MG PO TABS: 40.0000 mg | ORAL_TABLET | Freq: Every day | ORAL | 1 refills | Status: DC

## 2022-01-16 MED ORDER — LISINOPRIL 10 MG PO TABS: 10.0000 mg | ORAL_TABLET | Freq: Every day | ORAL | 1 refills | Status: DC

## 2022-01-16 NOTE — Addendum Note (Signed)
Addended by: Silvio Pate on: 01/16/2022 09:42 AM   Modules accepted: Orders

## 2022-01-16 NOTE — Progress Notes (Signed)
Subjective:    Patient ID: Connie Dyer, female    DOB: 1966/08/13, 56 y.o.   MRN: [id number]  HPI Pt is a 56 yo female with HTN, T2DM, GAD, HLD who presents to the clinic for 6 month follow up.   She is doing great. She has a new job and loving it. No mood concerns. She is not checking her sugars. She is taking all of her medications. No CP, palpitations, headaches, vision changes, leg pain, numbness or tingling. She walks a lot at work and with her dogs. She needs refills today.   .. 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/15/2015   Diabetes mellitus type II, controlled (HCC) 07/13/2017   Acute pain of left knee 05/24/2018   Dyslipidemia, goal LDL below 70 05/24/2019   Depressed mood 06/24/2020   GAD (generalized anxiety disorder) 06/24/2020   Vision changes 09/23/2020   Corn of foot 03/28/2021   Panic attack 09/15/2021   Resolved Ambulatory Problems    Diagnosis Date Noted   Hyperglycemia 05/10/2015   Type 2 diabetes mellitus (HCC) 07/24/2015   Ankle fracture, left 12/03/2015   Past Medical History:  Diagnosis Date   Diabetes mellitus without complication (HCC) 07/13/2017     Review of Systems  All other systems reviewed and are negative.     Objective:   Physical Exam Constitutional:      Appearance: Normal appearance. She is obese.  HENT:     Head: Normocephalic.  Neck:     Vascular: No carotid bruit.  Cardiovascular:     Rate and Rhythm: Normal rate and regular rhythm.     Pulses: Normal pulses.     Heart sounds: Normal heart sounds.  Pulmonary:     Effort: Pulmonary effort is normal.     Breath sounds: Normal breath sounds.  Musculoskeletal:     Right lower leg: No edema.     Left lower leg: No edema.  Lymphadenopathy:     Cervical: No cervical  adenopathy.  Neurological:     Mental Status: She is alert and oriented to person, place, and time.  Psychiatric:        Mood and Affect: Mood normal.   .. Depression screen [facility] 2/9 01/16/2022 09/15/2021 03/28/2021 12/20/2020 06/21/2020  Decreased Interest 0 0 0 0 2  Down, Depressed, Hopeless 0 2 0 0 2  PHQ - 2 Score 0 2 0 0 4  Altered sleeping 0 0 - 0 2  Tired, decreased energy 0 1 - 0 2  Change in appetite 0 0 - 0 0  Feeling bad or failure about yourself  0 1 - 0 1  Trouble concentrating 0 0 - 0 0  Moving slowly or fidgety/restless 0 0 - 0 0  Suicidal thoughts 0 0 - 0 0  PHQ-9 Score 0 4 - 0 9  Difficult doing work/chores Not difficult at all Not difficult at all - Not difficult at all Somewhat difficult   .Marland Kitchen GAD 7 : Generalized Anxiety Score 01/16/2022 09/15/2021 12/20/2020 06/21/2020  Nervous, Anxious, on Edge 1 2 1 3   Control/stop worrying 0 1 0 1  Worry too much - different things 0 1 0 1  Trouble relaxing 0 1 0 1  Restless 0 0 0 1  Easily annoyed or irritable 0 1 1 3  Afraid - awful might happen 0 0 0 0  Total GAD 7 Score 1 6 2 10   Anxiety Difficulty Not difficult at all Not difficult at all Not difficult at all Somewhat difficult    . Results for orders placed or performed in visit on 01/16/22  POCT glycosylated hemoglobin (Hb A1C)  Result Value Ref Range   Hemoglobin A1C 5.7 (A) 4.0 - 5.6 %   HbA1c POC (<> result, manual entry)     HbA1c, POC (prediabetic range)     HbA1c, POC (controlled diabetic range)             Assessment & Plan:  02/19/23Marland KitchenDiagnoses and all orders for this visit:  Essential hypertension -     COMPLETE METABOLIC PANEL WITH GFR -     lisinopril (ZESTRIL) 10 MG tablet; Take 1 tablet (10 mg total) by mouth daily.  Controlled type 2 diabetes mellitus with other specified complication, without long-term current use of insulin (HCC) -     POCT glycosylated hemoglobin (Hb A1C) -     COMPLETE METABOLIC PANEL WITH GFR -     Semaglutide, 1 MG/DOSE,  (OZEMPIC, 1 MG/DOSE,) 4 MG/3ML SOPN; INJECT 1 MG INTO SKIN ONCE A WEEK  Environmental and seasonal allergies -     loratadine (CLARITIN) 10 MG tablet; Take 1 tablet (10 mg total) by mouth daily.  GAD (generalized anxiety disorder) -     citalopram (CELEXA) 40 MG tablet; Take 1 tablet (40 mg total) by mouth daily.  Depressed mood -     citalopram (CELEXA) 40 MG tablet; Take 1 tablet (40 mg total) by mouth daily.  Dyslipidemia, goal LDL below 70 -     atorvastatin (LIPITOR) 40 MG tablet; Take 1 tablet (40 mg total) by mouth daily.  Need for shingles vaccine -     Varicella-zoster vaccine IM   A1C to goal. Stay on same medications BP to goal. On ACE On statin. LDL to goal. Eye exam tomorrow Foot exam UTD.  Shingles vaccine started today. Next shot 2-6 months. Covid vaccine/flu/pneumonia UTD.  Follow up in 6 months.

## 2022-01-17 LAB — COMPLETE METABOLIC PANEL WITH GFR
AG Ratio: 1.8 (calc) (ref 1.0–2.5)
ALT: 17 U/L (ref 6–29)
AST: 16 U/L (ref 10–35)
Albumin: 4.7 g/dL (ref 3.6–5.1)
Alkaline phosphatase (APISO): 23 U/L — ABNORMAL LOW (ref 37–153)
BUN: 14 mg/dL (ref 7–25)
CO2: 30 mmol/L (ref 20–32)
Calcium: 10.6 mg/dL — ABNORMAL HIGH (ref 8.6–10.4)
Chloride: 100 mmol/L (ref 98–110)
Creat: 0.96 mg/dL (ref 0.50–1.03)
Globulin: 2.6 g/dL (calc) (ref 1.9–3.7)
Glucose, Bld: 123 mg/dL — ABNORMAL HIGH (ref 65–99)
Potassium: 5.2 mmol/L (ref 3.5–5.3)
Sodium: 139 mmol/L (ref 135–146)
Total Bilirubin: 1 mg/dL (ref 0.2–1.2)
Total Protein: 7.3 g/dL (ref 6.1–8.1)
eGFR: 69 mL/min/{1.73_m2} (ref >=60)

## 2022-01-17 LAB — HM DIABETES EYE EXAM

## 2022-01-19 ENCOUNTER — Encounter: Payer: Self-pay | Admitting: Physician Assistant

## 2022-01-19 NOTE — Progress Notes (Signed)
Calium is up from last check. Have you increased calcium in diet? How much vitamin D are you taking?

## 2022-01-21 ENCOUNTER — Encounter: Payer: Self-pay | Admitting: Neurology

## 2022-02-10 ENCOUNTER — Other Ambulatory Visit: Payer: Self-pay | Admitting: Physician Assistant

## 2022-02-10 DIAGNOSIS — I10 Essential (primary) hypertension: Secondary | ICD-10-CM

## 2022-03-18 ENCOUNTER — Telehealth: Payer: Self-pay

## 2022-03-18 NOTE — Telephone Encounter (Addendum)
Initiated Prior authorization [id number] (1 MG/DOSE) 4MG /3ML pen-injectors ?Via: Covermymeds ?Case/Key: [id number] ?Status: approved  as of 03/18/22 ?Reason: no pa required refill too soon medication can be dispensed April 21st 2023 ?Notified Pt via: Mychart ?

## 2022-04-07 ENCOUNTER — Other Ambulatory Visit: Payer: Self-pay | Admitting: Neurology

## 2022-04-07 DIAGNOSIS — F410 Panic disorder [episodic paroxysmal anxiety] without agoraphobia: Secondary | ICD-10-CM

## 2022-04-07 NOTE — Telephone Encounter (Signed)
Last appt 01/16/2022 ?Xanax last written 09/15/2021 #30 with 1 refill ? ?

## 2022-04-08 MED ORDER — ALPRAZOLAM 0.5 MG PO TABS: 0.5000 mg | ORAL_TABLET | Freq: Two times a day (BID) | ORAL | 1 refills | Status: DC | PRN

## 2022-04-23 ENCOUNTER — Encounter: Payer: Self-pay | Admitting: Physician Assistant

## 2022-05-01 ENCOUNTER — Ambulatory Visit: Payer: BC Managed Care – PPO | Admitting: Physician Assistant

## 2022-05-01 ENCOUNTER — Encounter: Payer: Self-pay | Admitting: Physician Assistant

## 2022-05-01 VITALS — BP 110/49 | HR 95 | Ht 60.0 in | Wt 174.0 lb

## 2022-05-01 DIAGNOSIS — H6983 Other specified disorders of Eustachian tube, bilateral: Secondary | ICD-10-CM | POA: Insufficient documentation

## 2022-05-01 DIAGNOSIS — E1169 Type 2 diabetes mellitus with other specified complication: Secondary | ICD-10-CM

## 2022-05-01 MED ORDER — FLUTICASONE PROPIONATE 50 MCG/ACT NA SUSP: 2.0000 | Freq: Every day | NASAL | 11 refills | Status: DC

## 2022-05-01 MED ORDER — METHYLPREDNISOLONE 4 MG PO TBPK: ORAL_TABLET | ORAL | 0 refills | Status: DC

## 2022-05-01 NOTE — Patient Instructions (Addendum)
Consider decongestant Flonase daily Afrin short term  Medrol dose pack  Eustachian Tube Dysfunction  Eustachian tube dysfunction refers to a condition in which a blockage develops in the narrow passage that connects the middle ear to the back of the nose (eustachian tube). The eustachian tube regulates air pressure in the middle ear by letting air move between the ear and nose. It also helps to drain fluid from the middle ear space. Eustachian tube dysfunction can affect one or both ears. When the eustachian tube does not function properly, air pressure, fluid, or both can build up in the middle ear. What are the causes? This condition occurs when the eustachian tube becomes blocked or cannot open normally. Common causes of this condition include: Ear infections. Colds and other infections that affect the nose, mouth, and throat (upper respiratory tract). Allergies. Irritation from cigarette smoke. Irritation from stomach acid coming up into the esophagus (gastroesophageal reflux). The esophagus is the part of the body that moves food from the mouth to the stomach. Sudden changes in air pressure, such as from descending in an airplane or scuba diving. Abnormal growths in the nose or throat, such as: Growths that line the nose (nasal polyps). Abnormal growth of cells (tumors). Enlarged tissue at the back of the throat (adenoids). What increases the risk? You are more likely to develop this condition if: You smoke. You are overweight. You are a child who has: Certain birth defects of the mouth, such as cleft palate. Large tonsils or adenoids. What are the signs or symptoms? Common symptoms of this condition include: A feeling of fullness in the ear. Ear pain. Clicking or popping noises in the ear. Ringing in the ear (tinnitus). Hearing loss. Loss of balance. Dizziness. Symptoms may get worse when the air pressure around you changes, such as when you travel to an area of high  elevation, fly on an airplane, or go scuba diving. How is this diagnosed? This condition may be diagnosed based on: Your symptoms. A physical exam of your ears, nose, and throat. Tests, such as those that measure: The movement of your eardrum. Your hearing (audiometry). How is this treated? Treatment depends on the cause and severity of your condition. In mild cases, you may relieve your symptoms by moving air into your ears. This is called "popping the ears." In more severe cases, or if you have symptoms of fluid in your ears, treatment may include: Medicines to relieve congestion (decongestants). Medicines that treat allergies (antihistamines). Nasal sprays or ear drops that contain medicines that reduce swelling (steroids). A procedure to drain the fluid in your eardrum. In this procedure, a small tube may be placed in the eardrum to: Drain the fluid. Restore the air in the middle ear space. A procedure to insert a balloon device through the nose to inflate the opening of the eustachian tube (balloon dilation). Follow these instructions at home: Lifestyle Do not do any of the following until your health care provider approves: Travel to high altitudes. Fly in airplanes. Work in a Estate agent or room. Scuba dive. Do not use any products that contain nicotine or tobacco. These products include cigarettes, chewing tobacco, and vaping devices, such as e-cigarettes. If you need help quitting, ask your health care provider. Keep your ears dry. Wear fitted earplugs during showering and bathing. Dry your ears completely after. General instructions Take over-the-counter and prescription medicines only as told by your health care provider. Use techniques to help pop your ears as recommended by your  health care provider. These may include: Chewing gum. Yawning. Frequent, forceful swallowing. Closing your mouth, holding your nose closed, and gently blowing as if you are trying to blow  air out of your nose. Keep all follow-up visits. This is important. Contact a health care provider if: Your symptoms do not go away after treatment. Your symptoms come back after treatment. You are unable to pop your ears. You have: A fever. Pain in your ear. Pain in your head or neck. Fluid draining from your ear. Your hearing suddenly changes. You become very dizzy. You lose your balance. Get help right away if: You have a sudden, severe increase in any of your symptoms. Summary Eustachian tube dysfunction refers to a condition in which a blockage develops in the eustachian tube. It can be caused by ear infections, allergies, inhaled irritants, or abnormal growths in the nose or throat. Symptoms may include ear pain or fullness, hearing loss, or ringing in the ears. Mild cases are treated with techniques to unblock the ears, such as yawning or chewing gum. More severe cases are treated with medicines or procedures. This information is not intended to replace advice given to you by your health care provider. Make sure you discuss any questions you have with your health care provider. Document Revised: 01/27/2021 Document Reviewed: 01/27/2021 Elsevier Patient Education  2023 [employer].

## 2022-05-01 NOTE — Progress Notes (Signed)
   Acute Office Visit  Subjective:     Patient ID: Connie Dyer, female    DOB: 12/14/1965, 56 y.o.   MRN: [id number]  Chief Complaint  Patient presents with   Ear Problem    HPI Patient is in today for bilateral ear pressure, fullness and popping for the last 6 months. No dizziness, tinnitus, nausea or hearing loss. She feels like "something could be in there". Left seems to be worse than right. Not tried anything to make better. She is on Claritin daily.   .. 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/15/2015   Diabetes mellitus type II, controlled (HCC) 07/13/2017   Acute pain of left knee 05/24/2018   Dyslipidemia, goal LDL below 70 05/24/2019   Depressed mood 06/24/2020   GAD (generalized anxiety disorder) 06/24/2020   Vision changes 09/23/2020   Corn of foot 03/28/2021   Panic attack 09/15/2021   Dysfunction of both eustachian tubes 05/01/2022   Resolved Ambulatory Problems    Diagnosis Date Noted   Hyperglycemia 05/10/2015   Type 2 diabetes mellitus (HCC) 07/24/2015   Ankle fracture, left 12/03/2015   Past Medical History:  Diagnosis Date   Diabetes mellitus without complication (HCC) 07/13/2017       ROS See HPI.      Objective:    BP (!) 110/49   Pulse 95   Ht 5' (1.524 m)   Wt 174 lb (78.9 kg)   SpO2 97%   BMI 33.98 kg/m  BP Readings from Last 3 Encounters:  05/01/22 (!) 110/49  01/16/22 105/70  09/15/21 121/72      Physical Exam Vitals reviewed.  Constitutional:      Appearance: Normal appearance. She is obese.  HENT:     Head: Normocephalic.     Right Ear: Tympanic membrane, ear canal and external ear normal. There is no impacted cerumen.     Left Ear: Tympanic membrane, ear canal and external ear normal. There is no impacted cerumen.      Ears:     Comments: Gross hearing intact.     Nose: Nose normal.     Mouth/Throat:     Mouth: Mucous membranes are moist.     Pharynx: No oropharyngeal exudate or posterior oropharyngeal erythema.  Eyes:     Conjunctiva/sclera: Conjunctivae normal.  Cardiovascular:     Rate and Rhythm: Normal rate.  Pulmonary:     Effort: Pulmonary effort is normal.  Neurological:     Mental Status: She is alert.  Psychiatric:        Mood and Affect: Mood normal.         Assessment & Plan:  Marland KitchenMarland KitchenSteffie was seen today for ear problem.  Diagnoses and all orders for this visit:  Dysfunction of both eustachian tubes -     methylPREDNISolone (MEDROL DOSEPAK) 4 MG TBPK tablet; 6-day pack as directed -     fluticasone (FLONASE) 50 MCG/ACT nasal spray; Place 2 sprays into both nostrils daily.   Reassured no signs of infection Symptoms suggest ETD Continue on claritin daily Start flonase daily Sudafed/afrin as needed Medrol dose pack if symptoms not improving with first line treatment If not improving consider ENT referral.    Tandy Gaw, PA-C

## 2022-06-20 ENCOUNTER — Other Ambulatory Visit: Payer: Self-pay | Admitting: Physician Assistant

## 2022-06-20 DIAGNOSIS — E1169 Type 2 diabetes mellitus with other specified complication: Secondary | ICD-10-CM

## 2022-06-20 DIAGNOSIS — I10 Essential (primary) hypertension: Secondary | ICD-10-CM

## 2022-06-20 DIAGNOSIS — E785 Hyperlipidemia, unspecified: Secondary | ICD-10-CM

## 2022-07-17 ENCOUNTER — Encounter: Payer: Self-pay | Admitting: Physician Assistant

## 2022-07-17 ENCOUNTER — Ambulatory Visit: Payer: BC Managed Care – PPO | Admitting: Physician Assistant

## 2022-07-17 VITALS — BP 116/63 | HR 83 | Ht 60.0 in | Wt 175.0 lb

## 2022-07-17 DIAGNOSIS — E785 Hyperlipidemia, unspecified: Secondary | ICD-10-CM | POA: Diagnosis not present

## 2022-07-17 DIAGNOSIS — E1169 Type 2 diabetes mellitus with other specified complication: Secondary | ICD-10-CM

## 2022-07-17 DIAGNOSIS — F410 Panic disorder [episodic paroxysmal anxiety] without agoraphobia: Secondary | ICD-10-CM

## 2022-07-17 DIAGNOSIS — Z79899 Other long term (current) drug therapy: Secondary | ICD-10-CM | POA: Diagnosis not present

## 2022-07-17 DIAGNOSIS — Z1329 Encounter for screening for other suspected endocrine disorder: Secondary | ICD-10-CM | POA: Diagnosis not present

## 2022-07-17 DIAGNOSIS — Z23 Encounter for immunization: Secondary | ICD-10-CM | POA: Diagnosis not present

## 2022-07-17 DIAGNOSIS — I10 Essential (primary) hypertension: Secondary | ICD-10-CM

## 2022-07-17 LAB — POCT UA - MICROALBUMIN
Albumin/Creatinine Ratio, Urine, POC: <30
Creatinine, POC: 300 mg/dL
Microalbumin Ur, POC: 30 mg/L

## 2022-07-17 LAB — POCT GLYCOSYLATED HEMOGLOBIN (HGB A1C): HbA1c, POC (controlled diabetic range): 6.2 % (ref 0.0–7.0)

## 2022-07-17 MED ORDER — ALPRAZOLAM 0.5 MG PO TABS: 0.5000 mg | ORAL_TABLET | Freq: Two times a day (BID) | ORAL | 1 refills | Status: DC | PRN

## 2022-07-17 NOTE — Progress Notes (Signed)
Established Patient Office Visit  Subjective   Patient ID: Connie Dyer, female    DOB: [birth year]  Age: 56 y.o. MRN: [id number]  Chief Complaint  Patient presents with   Diabetes    HPI Pt is a 56 yo female with T2DM, HTN, HLD who presents to the clinic for 3 months follow up.   Pt is doing well. She is not checking sugars. She denies any open sores or wounds. No hypoglycemic events. No CP, palpitations, headaches or vision changes. She is staying active and watching her sugar intake.   She uses xanax very sparingly in the evenings for panic attacks and stress reaction. She has used 29 since October.   Patient Active Problem List   Diagnosis Date Noted   Need for shingles vaccine 07/17/2022   Dysfunction of both eustachian tubes 05/01/2022   Panic attack 09/15/2021   Corn of foot 03/28/2021   Vision changes 09/23/2020   Depressed mood 06/24/2020   GAD (generalized anxiety disorder) 06/24/2020   Dyslipidemia, goal LDL below 70 05/24/2019   Acute pain of left knee 05/24/2018   Diabetes mellitus type II, controlled (HCC) 07/13/2017   High risk HPV infection 05/15/2015   Hyperlipidemia 05/10/2015   History of colonic polyps 04/04/2015   Essential hypertension 04/03/2015   PTSD (post-traumatic stress disorder) 04/03/2015   Past Medical History:  Diagnosis Date   Ankle fracture, left 12/03/2015   Diabetes mellitus without complication (HCC) 07/13/2017   Essential hypertension 04/03/2015   High risk HPV infection 05/15/2015   Repeat pap by June 2017    History of colonic polyps 04/04/2015   2015: benign adenoma and sessile serrated polyp.    Hyperlipidemia 05/10/2015   AHA 10 year risk of 2.3% June 2016    PTSD (post-traumatic stress disorder) 04/03/2015   History reviewed. No pertinent family history. No Known Allergies    ROS See HPI.    Objective:     BP 116/63   Pulse 83   Ht 5' (1.524 m)   Wt 175 lb (79.4 kg)   SpO2 96%   BMI 34.18 kg/m  BP Readings from Last  3 Encounters:  07/17/22 116/63  05/01/22 (!) 110/49  01/16/22 105/70   Wt Readings from Last 3 Encounters:  07/17/22 175 lb (79.4 kg)  05/01/22 174 lb (78.9 kg)  01/16/22 174 lb (78.9 kg)    .Marland Kitchen Results for orders placed or performed in visit on 07/17/22  POCT glycosylated hemoglobin (Hb A1C)  Result Value Ref Range   Hemoglobin A1C     HbA1c POC (<> result, manual entry)     HbA1c, POC (prediabetic range)     HbA1c, POC (controlled diabetic range) 6.2 0.0 - 7.0 %  POCT UA - Microalbumin  Result Value Ref Range   Microalbumin Ur, POC 30 mg/L   Creatinine, POC 300 mg/dL   Albumin/Creatinine Ratio, Urine, POC <30      Physical Exam Constitutional:      Appearance: Normal appearance.  HENT:     Head: Normocephalic.  Cardiovascular:     Rate and Rhythm: Normal rate and regular rhythm.     Pulses: Normal pulses.     Heart sounds: Normal heart sounds.  Pulmonary:     Effort: Pulmonary effort is normal.  Musculoskeletal:     Cervical back: Normal range of motion.     Right lower leg: No edema.     Left lower leg: No edema.  Neurological:     General: No focal  deficit present.     Mental Status: She is alert and oriented to person, place, and time.  Psychiatric:        Mood and Affect: Mood normal.        Assessment & Plan:  Marland KitchenMarland KitchenDelight was seen today for diabetes.  Diagnoses and all orders for this visit:  Controlled type 2 diabetes mellitus with other specified complication, without long-term current use of insulin (HCC) -     POCT glycosylated hemoglobin (Hb A1C) -     COMPLETE METABOLIC PANEL WITH GFR -     POCT UA - Microalbumin  Essential hypertension -     COMPLETE METABOLIC PANEL WITH GFR  Dyslipidemia, goal LDL below 70 -     Lipid Panel w/reflex Direct LDL  Thyroid disorder screen -     TSH  Medication management -     POCT glycosylated hemoglobin (Hb A1C) -     TSH -     Lipid Panel w/reflex Direct LDL -     COMPLETE METABOLIC PANEL WITH  GFR -     CBC with Differential/Platelet  Panic attack -     ALPRAZolam (XANAX) 0.5 MG tablet; Take 1 tablet (0.5 mg total) by mouth 2 (two) times daily as needed for anxiety.  Need for shingles vaccine -     Varicella-zoster vaccine IM   A1C looks great today Continue same medications BP to goal, on ACE  On statin Eye exam UTD .Marland Kitchen Diabetic Foot Exam - Simple   Simple Foot Form Diabetic Foot exam was performed with the following findings: Yes 07/17/2022  8:20 AM  Visual Inspection No deformities, no ulcerations, no other skin breakdown bilaterally: Yes Sensation Testing Intact to touch and monofilament testing bilaterally: Yes Pulse Check Posterior Tibialis and Dorsalis pulse intact bilaterally: Yes Comments    Pneumonia UTD 2nd shingles dose given today Declined flu shot Follow up in 3 months  Xanax uses as needed 29 since October. Refilled today.  Screening labs ordered today.    Return in about 3 months (around 10/17/2022).    Tandy Gaw, PA-C

## 2022-07-18 LAB — COMPLETE METABOLIC PANEL WITH GFR
AG Ratio: 1.7 (calc) (ref 1.0–2.5)
ALT: 20 U/L (ref 6–29)
AST: 20 U/L (ref 10–35)
Albumin: 4.6 g/dL (ref 3.6–5.1)
Alkaline phosphatase (APISO): 23 U/L — ABNORMAL LOW (ref 37–153)
BUN: 13 mg/dL (ref 7–25)
CO2: 25 mmol/L (ref 20–32)
Calcium: 9.8 mg/dL (ref 8.6–10.4)
Chloride: 101 mmol/L (ref 98–110)
Creat: 0.92 mg/dL (ref 0.50–1.03)
Globulin: 2.7 g/dL (calc) (ref 1.9–3.7)
Glucose, Bld: 126 mg/dL — ABNORMAL HIGH (ref 65–99)
Potassium: 5.1 mmol/L (ref 3.5–5.3)
Sodium: 138 mmol/L (ref 135–146)
Total Bilirubin: 1 mg/dL (ref 0.2–1.2)
Total Protein: 7.3 g/dL (ref 6.1–8.1)
eGFR: 72 mL/min/{1.73_m2} (ref >=60)

## 2022-07-18 LAB — LIPID PANEL W/REFLEX DIRECT LDL
Cholesterol: 153 mg/dL (ref <200)
HDL: 60 mg/dL (ref >=50)
LDL Cholesterol (Calc): 77 mg/dL (calc)
Non-HDL Cholesterol (Calc): 93 mg/dL (calc) (ref <130)
Total CHOL/HDL Ratio: 2.6 (calc) (ref <5.0)
Triglycerides: 77 mg/dL (ref <150)

## 2022-07-18 LAB — CBC WITH DIFFERENTIAL/PLATELET
Absolute Monocytes: 427 cells/uL (ref 200–950)
Basophils Absolute: 38 cells/uL (ref 0–200)
Basophils Relative: 0.7 %
Eosinophils Absolute: 178 cells/uL (ref 15–500)
Eosinophils Relative: 3.3 %
HCT: 44.9 % (ref 35.0–45.0)
Hemoglobin: 14.9 g/dL (ref 11.7–15.5)
Lymphs Abs: 1220 cells/uL (ref 850–3900)
MCH: 30.2 pg (ref 27.0–33.0)
MCHC: 33.2 g/dL (ref 32.0–36.0)
MCV: 91.1 fL (ref 80.0–100.0)
MPV: 10.3 fL (ref 7.5–12.5)
Monocytes Relative: 7.9 %
Neutro Abs: 3537 cells/uL (ref 1500–7800)
Neutrophils Relative %: 65.5 %
Platelets: 266 10*3/uL (ref 140–400)
RBC: 4.93 10*6/uL (ref 3.80–5.10)
RDW: 12.3 % (ref 11.0–15.0)
Total Lymphocyte: 22.6 %
WBC: 5.4 10*3/uL (ref 3.8–10.8)

## 2022-07-18 LAB — TSH: TSH: 1.36 mIU/L (ref 0.40–4.50)

## 2022-07-20 NOTE — Progress Notes (Signed)
Connie Dyer,   Cholesterol looks good. Goal is LdL under 70 and 77. Watch fried/fatty/processed foods and make sure to take cholesterol medication. Recheck in 6 months, if not to goal then will increase medication. Thyroid looks great.   Overall labs look great!

## 2022-09-12 ENCOUNTER — Other Ambulatory Visit: Payer: Self-pay | Admitting: Physician Assistant

## 2022-09-12 DIAGNOSIS — E1169 Type 2 diabetes mellitus with other specified complication: Secondary | ICD-10-CM

## 2022-09-30 ENCOUNTER — Other Ambulatory Visit: Payer: Self-pay | Admitting: Physician Assistant

## 2022-09-30 DIAGNOSIS — E785 Hyperlipidemia, unspecified: Secondary | ICD-10-CM

## 2022-09-30 DIAGNOSIS — I10 Essential (primary) hypertension: Secondary | ICD-10-CM

## 2022-10-19 ENCOUNTER — Encounter: Payer: Self-pay | Admitting: Physician Assistant

## 2022-10-19 ENCOUNTER — Ambulatory Visit: Payer: BC Managed Care – PPO | Admitting: Physician Assistant

## 2022-10-19 VITALS — BP 114/54 | HR 83 | Ht 60.0 in | Wt 174.0 lb

## 2022-10-19 DIAGNOSIS — E1169 Type 2 diabetes mellitus with other specified complication: Secondary | ICD-10-CM

## 2022-10-19 DIAGNOSIS — E785 Hyperlipidemia, unspecified: Secondary | ICD-10-CM

## 2022-10-19 DIAGNOSIS — I10 Essential (primary) hypertension: Secondary | ICD-10-CM

## 2022-10-19 DIAGNOSIS — Z1231 Encounter for screening mammogram for malignant neoplasm of breast: Secondary | ICD-10-CM

## 2022-10-19 DIAGNOSIS — Z23 Encounter for immunization: Secondary | ICD-10-CM | POA: Diagnosis not present

## 2022-10-19 DIAGNOSIS — F411 Generalized anxiety disorder: Secondary | ICD-10-CM

## 2022-10-19 LAB — POCT GLYCOSYLATED HEMOGLOBIN (HGB A1C): Hemoglobin A1C: 6.1 % — AB (ref 4.0–5.6)

## 2022-10-19 MED ORDER — ATORVASTATIN CALCIUM 40 MG PO TABS: 40.0000 mg | ORAL_TABLET | Freq: Every day | ORAL | 2 refills | Status: DC

## 2022-10-19 NOTE — Progress Notes (Signed)
Established Patient Office Visit  Subjective   Patient ID: Connie Dyer, female    DOB: [birth year]  Age: 56 y.o. MRN: [id number]  Chief Complaint  Patient presents with   Follow-up   Diabetes    HPI Pt is a 56 yo obese female with T2DM, HTN, GAD, HLD who presents to the clinic for 3 month follow up.   Pt is doing well. She is not checking sugars. She denies any hypoglycemic events. Denies any CP, palpitations, headaches, vision changes. She is not exercising but is keeping a good diabetic diet. She is compliant with medications.   Her anxiety and depression are a little worse than usual. Her partner is more stressed so she is more stressed. No SI/HC. Continues on celexa.   .. 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/15/2015   Diabetes mellitus type II, controlled (HCC) 07/13/2017   Acute pain of left knee 05/24/2018   Dyslipidemia, goal LDL below 70 05/24/2019   Depressed mood 06/24/2020   GAD (generalized anxiety disorder) 06/24/2020   Vision changes 09/23/2020   Corn of foot 03/28/2021   Panic attack 09/15/2021   Dysfunction of both eustachian tubes 05/01/2022   Need for shingles vaccine 07/17/2022   Resolved Ambulatory Problems    Diagnosis Date Noted   Hyperglycemia 05/10/2015   Type 2 diabetes mellitus (HCC) 07/24/2015   Ankle fracture, left 12/03/2015   Past Medical History:  Diagnosis Date   Diabetes mellitus without complication (HCC) 07/13/2017      ROS   See HPI.  Objective:     BP (!) 114/54   Pulse 83   Ht 5' (1.524 m)   Wt 174 lb (78.9 kg)   SpO2 100%   BMI 33.98 kg/m  BP Readings from Last 3 Encounters:  10/19/22 (!) 114/54  07/17/22 116/63  05/01/22 (!) 110/49   Wt Readings from Last 3 Encounters:  10/19/22 174 lb (78.9 kg)  07/17/22 175  lb (79.4 kg)  05/01/22 174 lb (78.9 kg)    .Marland Kitchen Results for orders placed or performed in visit on 10/19/22  POCT glycosylated hemoglobin (Hb A1C)  Result Value Ref Range   Hemoglobin A1C 6.1 (A) 4.0 - 5.6 %   HbA1c POC (<> result, manual entry)     HbA1c, POC (prediabetic range)     HbA1c, POC (controlled diabetic range)     ..    10/19/2022    8:47 AM 07/17/2022    8:20 AM 01/16/2022    8:15 AM 09/15/2021    3:47 PM 03/28/2021    2:42 PM  Depression screen PHQ 2/9  Decreased Interest 1 0 0 0 0  Down, Depressed, Hopeless 1 1 0 2 0  PHQ - 2 Score 2 1 0 2 0  Altered sleeping 1  0 0   Tired, decreased energy 1  0 1   Change in appetite 0  0 0   Feeling bad or failure about yourself  1  0 1   Trouble concentrating 0  0 0   Moving slowly or fidgety/restless 0  0 0   Suicidal thoughts 0  0 0   PHQ-9 Score 5  0 4   Difficult doing work/chores Not difficult at all  Not difficult at all Not difficult at all    .Marland Kitchen  10/19/2022    8:47 AM 01/16/2022    8:15 AM 09/15/2021    3:48 PM 12/20/2020    1:40 PM  GAD 7 : Generalized Anxiety Score  Nervous, Anxious, on Edge 1 1 2 1   Control/stop worrying 0 0 1 0  Worry too much - different things 0 0 1 0  Trouble relaxing 0 0 1 0  Restless 0 0 0 0  Easily annoyed or irritable 1 0 1 1  Afraid - awful might happen 0 0 0 0  Total GAD 7 Score 2 1 6 2   Anxiety Difficulty Not difficult at all Not difficult at all Not difficult at all Not difficult at all      Physical Exam Constitutional:      Appearance: Normal appearance.  HENT:     Head: Normocephalic.  Cardiovascular:     Rate and Rhythm: Normal rate and regular rhythm.     Pulses: Normal pulses.  Pulmonary:     Breath sounds: Normal breath sounds.  Musculoskeletal:     Right lower leg: No edema.     Left lower leg: No edema.  Neurological:     General: No focal deficit present.     Mental Status: She is alert and oriented to person, place, and time.  Psychiatric:         Mood and Affect: Mood normal.          Assessment & Plan:   Immaculate was seen today for follow-up and diabetes.  Diagnoses and all orders for this visit:  Controlled type 2 diabetes mellitus with other specified complication, without long-term current use of insulin (HCC) -     POCT glycosylated hemoglobin (Hb A1C)  Encounter for screening mammogram for malignant neoplasm of breast -     MM 3D SCREEN BREAST BILATERAL  Need for immunization against influenza -     Flu Vaccine QUAD 2mo+IM (Fluarix, Fluzone & Alfiuria Quad PF)  Essential hypertension  Dyslipidemia, goal LDL below 70 -     atorvastatin (LIPITOR) 40 MG tablet; Take 1 tablet (40 mg total) by mouth daily.  GAD (generalized anxiety disorder)   A1C to goal Continue same medications BP to goal On statin Eye exam and foot exam UTD Flu shot given today without complications.   Anxiety and depression a little heightened right now due to season of lift being stressful PHQ and GAD up just a little Continue on celexa   Needs mammogram, order placed.   Elease Hashimoto, PA-C

## 2022-10-21 ENCOUNTER — Ambulatory Visit (INDEPENDENT_AMBULATORY_CARE_PROVIDER_SITE_OTHER): Payer: BC Managed Care – PPO

## 2022-10-21 DIAGNOSIS — Z1231 Encounter for screening mammogram for malignant neoplasm of breast: Secondary | ICD-10-CM | POA: Diagnosis not present

## 2022-10-26 NOTE — Progress Notes (Signed)
Normal mammogram. Follow up in one year.

## 2022-12-05 ENCOUNTER — Other Ambulatory Visit: Payer: Self-pay | Admitting: Physician Assistant

## 2022-12-05 DIAGNOSIS — E1169 Type 2 diabetes mellitus with other specified complication: Secondary | ICD-10-CM

## 2022-12-29 ENCOUNTER — Other Ambulatory Visit: Payer: Self-pay | Admitting: Physician Assistant

## 2022-12-29 DIAGNOSIS — I10 Essential (primary) hypertension: Secondary | ICD-10-CM

## 2022-12-29 DIAGNOSIS — R4589 Other symptoms and signs involving emotional state: Secondary | ICD-10-CM

## 2022-12-29 DIAGNOSIS — F411 Generalized anxiety disorder: Secondary | ICD-10-CM

## 2023-01-16 ENCOUNTER — Other Ambulatory Visit: Payer: Self-pay | Admitting: Physician Assistant

## 2023-01-16 DIAGNOSIS — F410 Panic disorder [episodic paroxysmal anxiety] without agoraphobia: Secondary | ICD-10-CM

## 2023-01-18 NOTE — Telephone Encounter (Signed)
Last appt 10/19/2022  Last written 07/17/2022 #30 with 1 refill

## 2023-04-19 ENCOUNTER — Ambulatory Visit: Payer: BC Managed Care – PPO | Admitting: Physician Assistant

## 2023-04-21 ENCOUNTER — Ambulatory Visit: Payer: BC Managed Care – PPO | Admitting: Physician Assistant

## 2023-06-28 ENCOUNTER — Encounter: Payer: Self-pay | Admitting: Physician Assistant

## 2023-07-04 ENCOUNTER — Other Ambulatory Visit: Payer: Self-pay | Admitting: Physician Assistant

## 2023-07-04 DIAGNOSIS — R4589 Other symptoms and signs involving emotional state: Secondary | ICD-10-CM

## 2023-07-04 DIAGNOSIS — I10 Essential (primary) hypertension: Secondary | ICD-10-CM

## 2023-07-04 DIAGNOSIS — F411 Generalized anxiety disorder: Secondary | ICD-10-CM

## 2023-07-27 ENCOUNTER — Other Ambulatory Visit: Payer: Self-pay | Admitting: Physician Assistant

## 2023-07-27 DIAGNOSIS — R4589 Other symptoms and signs involving emotional state: Secondary | ICD-10-CM

## 2023-07-27 DIAGNOSIS — E1169 Type 2 diabetes mellitus with other specified complication: Secondary | ICD-10-CM

## 2023-07-27 DIAGNOSIS — F410 Panic disorder [episodic paroxysmal anxiety] without agoraphobia: Secondary | ICD-10-CM

## 2023-07-27 DIAGNOSIS — F411 Generalized anxiety disorder: Secondary | ICD-10-CM

## 2023-08-06 NOTE — Telephone Encounter (Signed)
Needs appt every 6 month for controlled substance refills.

## 2023-08-06 NOTE — Telephone Encounter (Signed)
Called patient, LVM

## 2023-08-06 NOTE — Telephone Encounter (Signed)
Patient scheduled for 08/10/23 w/ Lesly Rubenstein, thanks.

## 2023-08-10 ENCOUNTER — Ambulatory Visit: Payer: BC Managed Care – PPO | Admitting: Physician Assistant

## 2023-08-20 ENCOUNTER — Encounter: Payer: Self-pay | Admitting: Physician Assistant

## 2023-08-20 ENCOUNTER — Ambulatory Visit (INDEPENDENT_AMBULATORY_CARE_PROVIDER_SITE_OTHER): Payer: No Typology Code available for payment source | Admitting: Physician Assistant

## 2023-08-20 VITALS — BP 109/51 | HR 81 | Temp 99.0°F | Ht 60.0 in | Wt 176.8 lb

## 2023-08-20 DIAGNOSIS — E1169 Type 2 diabetes mellitus with other specified complication: Secondary | ICD-10-CM

## 2023-08-20 DIAGNOSIS — Z7985 Long-term (current) use of injectable non-insulin antidiabetic drugs: Secondary | ICD-10-CM

## 2023-08-20 DIAGNOSIS — E118 Type 2 diabetes mellitus with unspecified complications: Secondary | ICD-10-CM | POA: Diagnosis not present

## 2023-08-20 DIAGNOSIS — E782 Mixed hyperlipidemia: Secondary | ICD-10-CM | POA: Diagnosis not present

## 2023-08-20 DIAGNOSIS — F411 Generalized anxiety disorder: Secondary | ICD-10-CM

## 2023-08-20 DIAGNOSIS — Z23 Encounter for immunization: Secondary | ICD-10-CM | POA: Diagnosis not present

## 2023-08-20 DIAGNOSIS — I10 Essential (primary) hypertension: Secondary | ICD-10-CM | POA: Diagnosis not present

## 2023-08-20 DIAGNOSIS — F410 Panic disorder [episodic paroxysmal anxiety] without agoraphobia: Secondary | ICD-10-CM

## 2023-08-20 DIAGNOSIS — R4589 Other symptoms and signs involving emotional state: Secondary | ICD-10-CM

## 2023-08-20 MED ORDER — OZEMPIC (1 MG/DOSE) 4 MG/3ML ~~LOC~~ SOPN: PEN_INJECTOR | SUBCUTANEOUS | 0 refills | Status: DC

## 2023-08-20 MED ORDER — ALPRAZOLAM 0.5 MG PO TABS
0.5000 mg | ORAL_TABLET | Freq: Two times a day (BID) | ORAL | 1 refills | Status: DC | PRN
Start: 2023-08-20 — End: 2024-02-18

## 2023-08-20 MED ORDER — CITALOPRAM HYDROBROMIDE 40 MG PO TABS
40.0000 mg | ORAL_TABLET | Freq: Every day | ORAL | 1 refills | Status: DC
Start: 2023-08-20 — End: 2023-11-30

## 2023-08-20 MED ORDER — LISINOPRIL 10 MG PO TABS
10.0000 mg | ORAL_TABLET | Freq: Every day | ORAL | 1 refills | Status: DC
Start: 2023-08-20 — End: 2023-09-21

## 2023-08-20 NOTE — Progress Notes (Signed)
Established Patient Office Visit  Subjective   Patient ID: Connie Dyer, female    DOB: 1966-10-17  Age: 57 y.o. MRN: [id number]  Chief Complaint  Patient presents with   Medication Refill    HPI Patient is a 57 year old obese female with type 2 diabetes, hypertension, hyperlipidemia, MDD, and anxiety who presents to the clinic for follow-up.  Patient's mood is controlled on Celexa and Xanax.  She has no concerns.  She denies any suicidal thoughts or homicidal idealizations.  She is not checking her sugars.  She denies any hypoglycemic events.  She is taking Ozempic weekly.  She denies any side effects of this medication.  She denies any chest pain, palpitations, headaches or vision changes.  She is active.  She has just joined a softball team.  She tries to keep a healthy diet.  She does admit to 2 falls associated with her activity.  She was not hurt with either.  She is compliant with medications.  .. 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/15/2015   Diabetes mellitus type II, controlled (HCC) 07/13/2017   Acute pain of left knee 05/24/2018   Dyslipidemia, goal LDL below 70 05/24/2019   Depressed mood 06/24/2020   GAD (generalized anxiety disorder) 06/24/2020   Vision changes 09/23/2020   Corn of foot 03/28/2021   Panic attack 09/15/2021   Dysfunction of both eustachian tubes 05/01/2022   Need for shingles vaccine 07/17/2022   Resolved Ambulatory Problems    Diagnosis Date Noted   Hyperglycemia 05/10/2015   Type 2 diabetes mellitus (HCC) 07/24/2015   Ankle fracture, left 12/03/2015   Past Medical History:  Diagnosis Date   Diabetes mellitus without complication (HCC) 07/13/2017     ROS   See HPI.  Objective:     BP (!) 109/51   Pulse 81   Temp 99 F (37.2 C)    Ht 5' (1.524 m)   Wt 176 lb 12 oz (80.2 kg)   LMP 01/09/2019   SpO2 99%   BMI 34.52 kg/m  BP Readings from Last 3 Encounters:  08/20/23 (!) 109/51  10/19/22 (!) 114/54  07/17/22 116/63   Wt Readings from Last 3 Encounters:  08/20/23 176 lb 12 oz (80.2 kg)  10/19/22 174 lb (78.9 kg)  07/17/22 175 lb (79.4 kg)      Physical Exam Constitutional:      Appearance: Normal appearance. She is obese.  HENT:     Head: Normocephalic.  Cardiovascular:     Rate and Rhythm: Normal rate and regular rhythm.     Pulses: Normal pulses.     Heart sounds: Normal heart sounds.  Pulmonary:     Effort: Pulmonary effort is normal.     Breath sounds: Normal breath sounds.  Musculoskeletal:     Right lower leg: No edema.     Left lower leg: No edema.  Neurological:     General: No focal deficit present.     Mental Status: She is alert and oriented to person, place, and time.  Psychiatric:        Mood and Affect: Mood normal.       The 10-year ASCVD risk score (Arnett DK, et al., 2019) is: 4.2%    Assessment & Plan:  Marland KitchenMarland KitchenMeranda "Bethann Berkshire" was seen today for medication refill.  Diagnoses and  all orders for this visit:  Essential hypertension -     Lipid panel -     CBC w/Diff/Platelet -     lisinopril (ZESTRIL) 10 MG tablet; Take 1 tablet (10 mg total) by mouth daily.  Mixed hyperlipidemia -     CMP14+EGFR  Controlled type 2 diabetes mellitus with complication, without long-term current use of insulin (HCC) -     Hemoglobin A1c  Panic attack -     ALPRAZolam (XANAX) 0.5 MG tablet; Take 1 tablet (0.5 mg total) by mouth 2 (two) times daily as needed for anxiety.  GAD (generalized anxiety disorder) -     citalopram (CELEXA) 40 MG tablet; Take 1 tablet (40 mg total) by mouth daily.  Depressed mood -     citalopram (CELEXA) 40 MG tablet; Take 1 tablet (40 mg total) by mouth daily.  Controlled type 2 diabetes mellitus with other specified complication, without long-term current use  of insulin (HCC) -     Semaglutide, 1 MG/DOSE, (OZEMPIC, 1 MG/DOSE,) 4 MG/3ML SOPN; INJECT 1MG  SUBCUTANEOUSLY  WEEKLY (EVERY 7 DAYS)  Encounter for immunization -     Flu vaccine trivalent PF, 6mos and older(Flulaval,Afluria,Fluarix,Fluzone)  Encounter for immunization [employer] Comirnaty Covid-19 Vaccine 55yrs & older   A1c to be done in labs .Marland Kitchen Lab Results  Component Value Date   HGBA1C 6.1 (A) 10/19/2022    Refilled ozempic BP to goal on ACE on statin Eye exam done will call for report at my eye doctor in [place] Foot exam UTD with no concerns Flu and covid given today  Mood great Celexa and xanax refilled  Labs ordered    Return in about 6 months (around 02/17/2024).    Tandy Gaw, PA-C

## 2023-09-19 ENCOUNTER — Other Ambulatory Visit: Payer: Self-pay | Admitting: Physician Assistant

## 2023-09-19 DIAGNOSIS — I10 Essential (primary) hypertension: Secondary | ICD-10-CM

## 2023-09-24 ENCOUNTER — Other Ambulatory Visit: Payer: Self-pay | Admitting: Medical Genetics

## 2023-09-24 DIAGNOSIS — Z006 Encounter for examination for normal comparison and control in clinical research program: Secondary | ICD-10-CM

## 2023-10-07 ENCOUNTER — Other Ambulatory Visit (HOSPITAL_COMMUNITY)
Admission: RE | Admit: 2023-10-07 | Discharge: 2023-10-07 | Disposition: A | Discharge status: Home / Self Care | Payer: Self-pay | Source: Ambulatory Visit | Attending: Oncology | Admitting: Oncology

## 2023-10-07 DIAGNOSIS — Z006 Encounter for examination for normal comparison and control in clinical research program: Secondary | ICD-10-CM | POA: Insufficient documentation

## 2023-10-19 LAB — GENECONNECT MOLECULAR SCREEN

## 2023-10-19 LAB — HELIX MOLECULAR SCREEN: Genetic Analysis Overall Interpretation: NEGATIVE

## 2023-11-18 ENCOUNTER — Other Ambulatory Visit: Payer: Self-pay | Admitting: Physician Assistant

## 2023-11-18 DIAGNOSIS — E1169 Type 2 diabetes mellitus with other specified complication: Secondary | ICD-10-CM

## 2023-11-28 ENCOUNTER — Other Ambulatory Visit: Payer: Self-pay | Admitting: Physician Assistant

## 2023-11-28 DIAGNOSIS — R4589 Other symptoms and signs involving emotional state: Secondary | ICD-10-CM

## 2023-11-28 DIAGNOSIS — F411 Generalized anxiety disorder: Secondary | ICD-10-CM

## 2024-02-02 ENCOUNTER — Encounter: Payer: Self-pay | Admitting: Physician Assistant

## 2024-02-07 NOTE — Telephone Encounter (Signed)
 Spoke with patient insurance co. Regarding PA for [employer]  PA  goes through [employer] # [phone] States PA must be completed by a form that will be faxed over  for completion

## 2024-02-11 ENCOUNTER — Encounter: Payer: Self-pay | Admitting: Physician Assistant

## 2024-02-11 DIAGNOSIS — Z13820 Encounter for screening for osteoporosis: Secondary | ICD-10-CM

## 2024-02-11 DIAGNOSIS — Z780 Asymptomatic menopausal state: Secondary | ICD-10-CM

## 2024-02-12 LAB — CMP14+EGFR
ALT: 21 IU/L (ref 0–32)
AST: 21 IU/L (ref 0–40)
Albumin: 4.5 g/dL (ref 3.8–4.9)
Alkaline Phosphatase: 26 IU/L — ABNORMAL LOW (ref 44–121)
BUN/Creatinine Ratio: 16 (ref 9–23)
BUN: 14 mg/dL (ref 6–24)
Bilirubin Total: 0.7 mg/dL (ref 0.0–1.2)
CO2: 24 mmol/L (ref 20–29)
Calcium: 9.6 mg/dL (ref 8.7–10.2)
Chloride: 101 mmol/L (ref 96–106)
Creatinine, Ser: 0.88 mg/dL (ref 0.57–1.00)
Globulin, Total: 2.4 g/dL (ref 1.5–4.5)
Glucose: 123 mg/dL — ABNORMAL HIGH (ref 70–99)
Potassium: 5 mmol/L (ref 3.5–5.2)
Sodium: 137 mmol/L (ref 134–144)
Total Protein: 6.9 g/dL (ref 6.0–8.5)
eGFR: 76 mL/min/{1.73_m2} (ref >59)

## 2024-02-12 LAB — CBC WITH DIFFERENTIAL/PLATELET
Basophils Absolute: 0 10*3/uL (ref 0.0–0.2)
Basos: 1 %
EOS (ABSOLUTE): 0.2 10*3/uL (ref 0.0–0.4)
Eos: 4 %
Hematocrit: 44 % (ref 34.0–46.6)
Hemoglobin: 14.4 g/dL (ref 11.1–15.9)
Immature Grans (Abs): 0 10*3/uL (ref 0.0–0.1)
Immature Granulocytes: 0 %
Lymphocytes Absolute: 1.5 10*3/uL (ref 0.7–3.1)
Lymphs: 28 %
MCH: 29.7 pg (ref 26.6–33.0)
MCHC: 32.7 g/dL (ref 31.5–35.7)
MCV: 91 fL (ref 79–97)
Monocytes Absolute: 0.5 10*3/uL (ref 0.1–0.9)
Monocytes: 9 %
Neutrophils Absolute: 3.2 10*3/uL (ref 1.4–7.0)
Neutrophils: 58 %
Platelets: 253 10*3/uL (ref 150–450)
RBC: 4.85 x10E6/uL (ref 3.77–5.28)
RDW: 12.5 % (ref 11.7–15.4)
WBC: 5.4 10*3/uL (ref 3.4–10.8)

## 2024-02-12 LAB — LIPID PANEL
Chol/HDL Ratio: 3.1 ratio (ref 0.0–4.4)
Cholesterol, Total: 169 mg/dL (ref 100–199)
HDL: 55 mg/dL (ref >39)
LDL Chol Calc (NIH): 103 mg/dL — ABNORMAL HIGH (ref 0–99)
Triglycerides: 58 mg/dL (ref 0–149)
VLDL Cholesterol Cal: 11 mg/dL (ref 5–40)

## 2024-02-12 LAB — HEMOGLOBIN A1C
Est. average glucose Bld gHb Est-mCnc: 126 mg/dL
Hgb A1c MFr Bld: 6 % — ABNORMAL HIGH (ref 4.8–5.6)

## 2024-02-14 ENCOUNTER — Other Ambulatory Visit: Payer: Self-pay | Admitting: Physician Assistant

## 2024-02-14 DIAGNOSIS — R4589 Other symptoms and signs involving emotional state: Secondary | ICD-10-CM

## 2024-02-14 DIAGNOSIS — F411 Generalized anxiety disorder: Secondary | ICD-10-CM

## 2024-02-15 ENCOUNTER — Encounter: Payer: Self-pay | Admitting: Physician Assistant

## 2024-02-15 NOTE — Progress Notes (Signed)
 Labs overall look good. Will discuss in detail on 3/21.

## 2024-02-16 ENCOUNTER — Other Ambulatory Visit

## 2024-02-16 MED ORDER — CITALOPRAM HYDROBROMIDE 40 MG PO TABS: 40.0000 mg | ORAL_TABLET | Freq: Every day | ORAL | 0 refills | Status: DC

## 2024-02-18 ENCOUNTER — Encounter: Payer: Self-pay | Admitting: Physician Assistant

## 2024-02-18 ENCOUNTER — Ambulatory Visit: Payer: BC Managed Care – PPO | Admitting: Physician Assistant

## 2024-02-18 VITALS — BP 115/75 | HR 82 | Ht 60.0 in | Wt 174.2 lb

## 2024-02-18 DIAGNOSIS — F410 Panic disorder [episodic paroxysmal anxiety] without agoraphobia: Secondary | ICD-10-CM

## 2024-02-18 DIAGNOSIS — E6609 Other obesity due to excess calories: Secondary | ICD-10-CM

## 2024-02-18 DIAGNOSIS — R809 Proteinuria, unspecified: Secondary | ICD-10-CM | POA: Insufficient documentation

## 2024-02-18 DIAGNOSIS — Z6834 Body mass index (BMI) 34.0-34.9, adult: Secondary | ICD-10-CM

## 2024-02-18 DIAGNOSIS — F411 Generalized anxiety disorder: Secondary | ICD-10-CM | POA: Diagnosis not present

## 2024-02-18 DIAGNOSIS — E785 Hyperlipidemia, unspecified: Secondary | ICD-10-CM

## 2024-02-18 DIAGNOSIS — I10 Essential (primary) hypertension: Secondary | ICD-10-CM | POA: Diagnosis not present

## 2024-02-18 DIAGNOSIS — E66811 Obesity, class 1: Secondary | ICD-10-CM

## 2024-02-18 DIAGNOSIS — R4589 Other symptoms and signs involving emotional state: Secondary | ICD-10-CM | POA: Diagnosis not present

## 2024-02-18 DIAGNOSIS — E118 Type 2 diabetes mellitus with unspecified complications: Secondary | ICD-10-CM

## 2024-02-18 DIAGNOSIS — Z7985 Long-term (current) use of injectable non-insulin antidiabetic drugs: Secondary | ICD-10-CM

## 2024-02-18 LAB — POCT UA - MICROALBUMIN
Creatinine, POC: 50 mg/dL
Microalbumin Ur, POC: 10 mg/L

## 2024-02-18 MED ORDER — ALPRAZOLAM 0.5 MG PO TABS: 0.5000 mg | ORAL_TABLET | Freq: Two times a day (BID) | ORAL | 1 refills | Status: DC | PRN

## 2024-02-18 MED ORDER — ATORVASTATIN CALCIUM 40 MG PO TABS: 40.0000 mg | ORAL_TABLET | Freq: Every day | ORAL | 3 refills | Status: DC

## 2024-02-18 MED ORDER — CITALOPRAM HYDROBROMIDE 40 MG PO TABS: 40.0000 mg | ORAL_TABLET | Freq: Every day | ORAL | 3 refills | Status: DC

## 2024-02-18 MED ORDER — LISINOPRIL 10 MG PO TABS: 10.0000 mg | ORAL_TABLET | Freq: Every day | ORAL | 1 refills | Status: DC

## 2024-02-18 NOTE — Progress Notes (Signed)
 Connie Dyer,   You do have a little protein detected in urine. Your kidney function does look good. Will recheck in 6 months. If still protein we can discussed medications to help prevent kidney function decline.

## 2024-02-18 NOTE — Progress Notes (Signed)
 Established Patient Office Visit  Subjective   Patient ID: Connie Dyer, female    DOB: 12/07/1965  Age: 58 y.o. MRN: [id number]  Chief Complaint  Patient presents with   Medical Management of Chronic Issues    HTN, MDD, and GAD CTDM last A1C 6.0    HPI Pt is a 58 yo obese female with HTN, T2DM, MDD, GAD who presents to the clinic for 3 month follow up and medication refills.   Mood is doing great. No concerns.   Pt had labs drawn before visit.  LDL 103 A1C 6.0  Pt is not checking sugars. Pt is active. Pt is compliant with medications. Denies any CP, palpitations, headaches or vision changes.    Review of Systems  All other systems reviewed and are negative.     Objective:     BP 115/75 (BP Location: Left Arm, Patient Position: Sitting, Cuff Size: Normal)   Pulse 82   Ht 5' (1.524 m)   Wt 174 lb 4 oz (79 kg)   LMP 01/09/2019   SpO2 93%   BMI 34.03 kg/m  BP Readings from Last 3 Encounters:  02/18/24 115/75  08/20/23 (!) 109/51  10/19/22 (!) 114/54   Wt Readings from Last 3 Encounters:  02/18/24 174 lb 4 oz (79 kg)  08/20/23 176 lb 12 oz (80.2 kg)  10/19/22 174 lb (78.9 kg)      Physical Exam Constitutional:      Appearance: Normal appearance. She is obese.  HENT:     Head: Normocephalic.  Cardiovascular:     Rate and Rhythm: Normal rate and regular rhythm.  Pulmonary:     Effort: Pulmonary effort is normal.  Musculoskeletal:     Right lower leg: No edema.     Left lower leg: No edema.  Neurological:     General: No focal deficit present.     Mental Status: She is oriented to person, place, and time.  Psychiatric:        Mood and Affect: Mood normal.      Last lipids Lab Results  Component Value Date   CHOL 169 02/11/2024   HDL 55 02/11/2024   LDLCALC 103 (H) 02/11/2024   TRIG 58 02/11/2024   CHOLHDL 3.1 02/11/2024   Last hemoglobin A1c Lab Results  Component Value Date   HGBA1C 6.0 (H) 02/11/2024      The 10-year ASCVD risk  score (Arnett DK, et al., 2019) is: 5.4%    Assessment & Plan:  Marland KitchenMarland KitchenLariya "Bethann Dyer" was seen today for medical management of chronic issues.  Diagnoses and all orders for this visit:  Controlled type 2 diabetes mellitus with complication, without long-term current use of insulin (HCC)  GAD (generalized anxiety disorder) -     citalopram (CELEXA) 40 MG tablet; Take 1 tablet (40 mg total) by mouth daily.  Depressed mood -     citalopram (CELEXA) 40 MG tablet; Take 1 tablet (40 mg total) by mouth daily.  Dyslipidemia, goal LDL below 70 -     atorvastatin (LIPITOR) 40 MG tablet; Take 1 tablet (40 mg total) by mouth daily.  Essential hypertension -     lisinopril (ZESTRIL) 10 MG tablet; Take 1 tablet (10 mg total) by mouth daily.  Panic attack -     ALPRAZolam (XANAX) 0.5 MG tablet; Take 1 tablet (0.5 mg total) by mouth 2 (two) times daily as needed for anxiety.  Class 1 obesity due to excess calories with serious comorbidity and body mass index (  BMI) of 34.0 to 34.9 in adult   A1C to goal Continue on same medications BP to goal LDL not quite to goal but reports missing dosages Will start taking daily and repeat in 1 year Needs eye exam Pneumonia vaccine given today Declined covid vaccine  Celexa and xanax refilled.     Return in about 6 months (around 08/20/2024).    Tandy Gaw, PA-C

## 2024-02-20 ENCOUNTER — Encounter: Payer: Self-pay | Admitting: Physician Assistant

## 2024-02-25 ENCOUNTER — Other Ambulatory Visit (HOSPITAL_COMMUNITY): Payer: Self-pay

## 2024-02-29 ENCOUNTER — Other Ambulatory Visit (HOSPITAL_COMMUNITY): Payer: Self-pay

## 2024-03-01 ENCOUNTER — Other Ambulatory Visit (HOSPITAL_COMMUNITY): Payer: Self-pay

## 2024-03-01 NOTE — Telephone Encounter (Signed)
 Have you contacted the patient to request this information?

## 2024-03-08 NOTE — Telephone Encounter (Signed)
 PA request for Ozempic. Thanks in advance.

## 2024-03-10 ENCOUNTER — Other Ambulatory Visit (HOSPITAL_COMMUNITY): Payer: Self-pay

## 2024-03-10 ENCOUNTER — Telehealth: Payer: Self-pay

## 2024-03-10 NOTE — Telephone Encounter (Signed)
 Called insurance number provided by pt, and they are refaxing the form as this is the preferred PA method per representative. Rep stated that fax should be received within 24 hours, will be on the lookout for form. Rep is faxing to [phone].

## 2024-03-10 NOTE — Telephone Encounter (Signed)
 Pharmacy Patient Advocate Encounter   Received notification from Patient Advice Request messages that prior authorization for Ozempic is required/requested.   Insurance verification completed.   The patient is insured through  [place]  .   PA Required, called [phone] to initiate PA. Rep is faxing form to [phone], PA team aware to be on the lookout for form.

## 2024-03-15 ENCOUNTER — Other Ambulatory Visit (HOSPITAL_COMMUNITY): Payer: Self-pay

## 2024-03-15 NOTE — Telephone Encounter (Signed)
 PA form received, filled out, and faxed back to insurance.

## 2024-03-15 NOTE — Telephone Encounter (Signed)
 PA form has been faxed back to [employer], waiting on decision, thanks.

## 2024-03-23 ENCOUNTER — Other Ambulatory Visit (HOSPITAL_COMMUNITY): Payer: Self-pay

## 2024-04-05 ENCOUNTER — Other Ambulatory Visit (HOSPITAL_COMMUNITY): Payer: Self-pay

## 2024-04-05 NOTE — Telephone Encounter (Signed)
 Refaxed to insurance, original fax wasn't received per insurance rep (confirmation fax was received on 03/15/2024)

## 2024-04-07 NOTE — Telephone Encounter (Signed)
 Pharmacy Patient Advocate Encounter  Received notification from [employer] that Prior Authorization for OZEMPIC  1MG /DOSE has been DENIED.  Full denial letter will be uploaded to the media tab. See denial reason below.   PA #/Case ID/Reference #: [id number]

## 2024-04-10 NOTE — Telephone Encounter (Signed)
 Prior auth for Ozempic  was denied by the insurance. Per the criteria, patient must have a diagnosis of Diabetes Mellitus and/or have an A1c greater than 6.5 %. Patient has been notified of the outcome via a MyChart message.

## 2024-04-11 NOTE — Telephone Encounter (Signed)
 She is correct, this is an incorrect denial, I have resubmitted. We sent chart notes and labs back as far as 2021, but pt hasn't had an A1c of 6.5 or greater since 2020, those labs have been sent to insurance with a request for them to review as urgent since pt meets all of the criteria. Thanks for your patience as we ask insurance to reconsider.

## 2024-04-12 NOTE — Telephone Encounter (Signed)
 Thank you :)

## 2024-05-25 ENCOUNTER — Other Ambulatory Visit (HOSPITAL_COMMUNITY): Payer: Self-pay

## 2024-05-25 NOTE — Telephone Encounter (Signed)
 Called insurance to discuss appeal options since we have yet to receive a determination, they state that they received chart notes a request, but that it is still denied, as a specific appeal form was not received. Requesting an appeal form via fax so that we can get this approved. Apologies for the delay.

## 2024-06-23 ENCOUNTER — Encounter: Payer: Self-pay | Admitting: Physician Assistant

## 2024-06-26 NOTE — Telephone Encounter (Signed)
 Can we route to PA department for alternatives?

## 2024-06-26 NOTE — Telephone Encounter (Signed)
 Please see mychart sent as an FYI.

## 2024-06-27 ENCOUNTER — Other Ambulatory Visit (HOSPITAL_COMMUNITY): Payer: Self-pay

## 2024-06-27 NOTE — Telephone Encounter (Signed)
 Placed a call to the insurance to check to see if we could resubmit a PA or if an appeal is required.   Per the representative Jada, she stated the A1C values sent in did not show a value greater than or equal to 6.5% and requested we send in corrected ones.   I faxed over lab results from 03/24/19 showing an A1C of 7.3.   Fax# [phone] Phone#  [phone]  Case ID: [id number]

## 2024-06-30 ENCOUNTER — Other Ambulatory Visit (HOSPITAL_COMMUNITY): Payer: Self-pay

## 2024-06-30 NOTE — Telephone Encounter (Signed)
 Pharmacy Patient Advocate Encounter  Received notification from [employer] that Prior Authorization for Ozempic  4mg /86ml has been APPROVED from 06/26/24 to 06/26/25   PA #/Case ID/Reference #: [id number]  Placed a call [facility] to notify of the approval. The RPH said she was not sure if they had enough of the medication in stock and would let the patient know if they needed to order it for Monday.

## 2024-07-06 ENCOUNTER — Ambulatory Visit: Admitting: Orthopedic Surgery

## 2024-07-25 ENCOUNTER — Encounter: Payer: Self-pay | Admitting: Physician Assistant

## 2024-07-28 ENCOUNTER — Ambulatory Visit: Admitting: Physician Assistant

## 2024-08-01 ENCOUNTER — Ambulatory Visit: Admitting: Physician Assistant

## 2024-08-22 ENCOUNTER — Ambulatory Visit: Admitting: Physician Assistant

## 2024-08-22 DIAGNOSIS — E118 Type 2 diabetes mellitus with unspecified complications: Secondary | ICD-10-CM

## 2024-08-29 ENCOUNTER — Encounter: Admitting: Physician Assistant

## 2024-08-29 DIAGNOSIS — Z0000 Encounter for general adult medical examination without abnormal findings: Secondary | ICD-10-CM

## 2024-09-15 ENCOUNTER — Ambulatory Visit (INDEPENDENT_AMBULATORY_CARE_PROVIDER_SITE_OTHER)

## 2024-09-15 ENCOUNTER — Ambulatory Visit (INDEPENDENT_AMBULATORY_CARE_PROVIDER_SITE_OTHER): Admitting: Physician Assistant

## 2024-09-15 VITALS — BP 140/65 | HR 75 | Ht 60.0 in | Wt 177.1 lb

## 2024-09-15 DIAGNOSIS — G8929 Other chronic pain: Secondary | ICD-10-CM

## 2024-09-15 DIAGNOSIS — M25512 Pain in left shoulder: Secondary | ICD-10-CM | POA: Diagnosis not present

## 2024-09-15 NOTE — Progress Notes (Unsigned)
 Acute Office Visit  Subjective:     Patient ID: Connie Dyer, female    DOB: Jan 22, 1966, 58 y.o.   MRN: [id number]  Chief Complaint  Patient presents with   Shoulder Pain    Left shoulder    HPI .SABRADiscussed the use of AI scribe software for clinical note transcription with the patient, who gave verbal consent to proceed.  History of Present Illness Connie Dyer is a 58 year old female who presents with left shoulder pain since June.  Left shoulder pain - Onset in June - Pain is deep within the shoulder, without a specific location or radiation - Pain is posterior and lateral in character - No known injury associated with onset - Pain occurs more at rest, particularly when sitting, rather than during physical activity - No exacerbation with physical activities such as moving furniture - No shooting pain - Does not usually sleep on her left side but when she lays on left side does not seem to be any more painful  Symptom management and modifying factors - Uses ibuprofen and anti-inflammatories for pain relief, with some benefit - Prefers to avoid pain medications when possible - Ergonomic adjustments to desk setup and phone usage have provided some improvement - Gentle stretching provides temporary relief - Has not tried massage therapy  Relevant musculoskeletal history - History of similar right shoulder pain years ago, resolved by changing how she opened a heavy office door - Suspects current pain may be related to phone usage and desk ergonomics   ROS See HPI.      Objective:    BP (!) 140/65   Pulse 75   Ht 5' (1.524 m)   Wt 177 lb 0.8 oz (80.3 kg)   LMP 01/09/2019   SpO2 100%   BMI 34.58 kg/m  BP Readings from Last 3 Encounters:  09/15/24 (!) 140/65  02/18/24 115/75  08/20/23 (!) 109/51   Wt Readings from Last 3 Encounters:  09/15/24 177 lb 0.8 oz (80.3 kg)  02/18/24 174 lb 4 oz (79 kg)  08/20/23 176 lb 12 oz (80.2 kg)       Physical Exam Constitutional:      Appearance: Normal appearance.  HENT:     Head: Normocephalic.  Cardiovascular:     Rate and Rhythm: Normal rate.  Pulmonary:     Effort: Pulmonary effort is normal.  Musculoskeletal:     Comments: NROM of left shoulder Pain to palpation was mild and over supraspinatus into left shoulder No tenderness over AC joint or lateral shoulder or anterior shoulder over bicep tendon insertion Negative drop arm testing Pain with external ROM no pain with internal ROM of left shoulder Negative yeragson Strength upper left arm 4/5  Neurological:     General: No focal deficit present.     Mental Status: She is alert and oriented to person, place, and time.  Psychiatric:        Mood and Affect: Mood normal.          Assessment & Plan:  Connie Dyer was seen today for shoulder pain.  Diagnoses and all orders for this visit:  Chronic left shoulder pain -     DG Shoulder Left; Future -     Ambulatory referral to Physical Therapy   Assessment & Plan Left shoulder pain Chronic deep joint pain possibly related to arthritis, alleviated by ibuprofen and stretching. Ergonomic adjustments provided some relief. - Order left shoulder X-ray to evaluate for arthritis or structural issues. -  Rotator cuff exercises given to start - Refer to physical therapy for rotator cuff exercises and muscle evaluation. I think some of her supraspinatus muscle could be involved. - Consider MRI if X-ray is inconclusive and symptoms persist. - Recommend Icy Hot Patches for symptomatic relief. - Suggest massage therapy for muscle relaxation. - Advise continuation of ibuprofen as needed.   Follow up in 4 weeks if not resolved or improving.     Meika Earll, PA-C

## 2024-09-18 DIAGNOSIS — G8929 Other chronic pain: Secondary | ICD-10-CM | POA: Insufficient documentation

## 2024-09-18 NOTE — Therapy (Unsigned)
 OUTPATIENT PHYSICAL THERAPY SHOULDER EVALUATION   Patient Name: Connie Dyer MRN: [id number] DOB:Apr 19, 1966, 58 y.o., female Today's Date: 09/19/2024  END OF SESSION:  PT End of Session - 09/19/24 1020     Visit Number 1    Number of Visits 16    Date for Recertification  11/14/24    Authorization Type not available    PT Start Time 0930    PT Stop Time 1015    PT Time Calculation (min) 45 min    Activity Tolerance Patient tolerated treatment well          Past Medical History:  Diagnosis Date   Ankle fracture, left 12/03/2015   Diabetes mellitus without complication (HCC) 07/13/2017   Essential hypertension 04/03/2015   High risk HPV infection 05/15/2015   Repeat pap by June 2017    History of colonic polyps 04/04/2015   2015: benign adenoma and sessile serrated polyp.    Hyperlipidemia 05/10/2015   AHA 10 year risk of 2.3% June 2016    PTSD (post-traumatic stress disorder) 04/03/2015   Past Surgical History:  Procedure Laterality Date   CHOLECYSTECTOMY     Patient Active Problem List   Diagnosis Date Noted   Chronic left shoulder pain 09/18/2024   Class 2 severe obesity due to excess calories with serious comorbidity and body mass index (BMI) of 35.0 to 35.9 in adult 02/18/2024   Microalbuminuria 02/18/2024   Need for shingles vaccine 07/17/2022   Dysfunction of both eustachian tubes 05/01/2022   Panic attack 09/15/2021   Corn of foot 03/28/2021   Vision changes 09/23/2020   Depressed mood 06/24/2020   GAD (generalized anxiety disorder) 06/24/2020   Dyslipidemia, goal LDL below 70 05/24/2019   Acute pain of left knee 05/24/2018   Diabetes mellitus type II, controlled (HCC) 07/13/2017   High risk HPV infection 05/15/2015   Hyperlipidemia 05/10/2015   History of colonic polyps 04/04/2015   Essential hypertension 04/03/2015   PTSD (post-traumatic stress disorder) 04/03/2015    PCP: Vermell LITTIE Bologna, PA-C  REFERRING PROVIDER: Vermell LITTIE Bologna, PA-C  REFERRING  DIAG: Chronic L shoulder pain   THERAPY DIAG:  Chronic left shoulder pain  Other symptoms and signs involving the musculoskeletal system  Abnormal posture  Rationale for Evaluation and Treatment: Rehabilitation  ONSET DATE: 05/14/24  SUBJECTIVE:                                                                                                                                                                                      SUBJECTIVE STATEMENT: Patient reports L shoulder pain over the past 4 months with no known injury. She did start working from home  and has tried to look at her ergonomics. Pain is increased when L UE is not supported.    Hand dominance: Right  PERTINENT HISTORY: She has a history of R shoulder pain in the past ~ 10-15 yrs ago resolved with modifications in activity. AODM; obesity; HTN; hyperlipidemia; PTSD; L ankle fx 2017  PAIN:  Are you having pain? Yes: NPRS scale: 2/10; worst in past wk 8/10; best 0/10  Pain location: pain is deep in the shoulder  Pain description: ache Aggravating factors: sitting with arm unsupported Relieving factors: heat; OTC meds; stretches (temporary relief); pillow for bed when on her phone   PRECAUTIONS: None  RED FLAGS: None   WEIGHT BEARING RESTRICTIONS: No  FALLS:  Has patient fallen in last 6 months? Yes. 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  DIAGNOSTIC FINDINGS:  Xray L shoulder 09/15/24 - results not available   PATIENT SURVEYS:   Quick DASH 50/100; 50%   COGNITION: Overall cognitive status: Within functional limits for tasks assessed     SENSATION: WFL  POSTURE: Patient presents with head forward posture with increased thoracic kyphosis; shoulders rounded and elevated; scapulae abducted and rotated along the thoracic spine; head of the humerus anterior in orientation.   CERVICAL ROM:   Active ROM A/PROM (deg) eval  Flexion 68  Extension 33  Right lateral flexion 32 tight > L  Left lateral flexion 30 tight  Right rotation 53 tight   Left rotation 53 tight    (Blank rows = not tested)   UPPER EXTREMITY ROM:   Active ROM Right eval Left eval  Shoulder flexion 158 154 sore  Shoulder extension    Shoulder abduction 162 150 pain  Shoulder adduction    Shoulder internal rotation T 10 T 10 discomfort   Shoulder external rotation shd 90/90 100 85 pain  Elbow flexion    Elbow extension    Wrist flexion    Wrist extension    Wrist ulnar deviation    Wrist radial deviation    Wrist pronation    Wrist supination    (Blank rows = not tested)  UPPER EXTREMITY MMT: pain with resistive testing  L UE   MMT Right eval Left eval  Shoulder flexion 5 5  Shoulder extension 5 5  Shoulder abduction 5 5  Shoulder adduction    Shoulder internal rotation    Shoulder external rotation 5 5  Middle trapezius 4 4  Lower trapezius 4 4  Elbow flexion    Elbow extension    Wrist flexion    Wrist extension    Wrist ulnar deviation    Wrist radial deviation    Wrist pronation    Wrist supination    Grip strength (lbs)    (Blank rows = not tested)   JOINT MOBILITY TESTING:  Decreased joint mobility L GH joint   PALPATION:  Muscular tightness L > R ant/lat/posterior cervical musculature; pecs; upper trap; leveator; L periscapular musculature Tightness with PA mobs thoracic spine; pain and tightness with lateral mobs  L thoracic spine                                                                                                                               TREATMENT DATE: 09/19/24: review of evaluation findings; POC; postural correction; HEP; ergonomic handout    PATIENT EDUCATION: Education details: POC; HEP  Person educated: Patient Education method: Explanation, Demonstration, Tactile cues, Verbal cues, and Handouts Education comprehension: verbalized understanding, returned demonstration, verbal cues required, tactile cues required, and needs further education  HOME EXERCISE PROGRAM: Access Code: [email or web address] URL: [email or web address] Date: 09/19/2024 Prepared by: Betina Puckett  Exercises - Seated Cervical Retraction  - 2 x daily - 7 x weekly - 1-2 sets - 5-10 reps - 10 sec  hold - Seated Scapular Retraction  - 2 x daily - 7 x weekly - 1-2 sets - 10 reps - 10 sec  hold - Shoulder External Rotation and Scapular Retraction  - 1-2 x daily - 7 x weekly - 1 sets - 10 reps - 3-5 sec   hold - Standing Shoulder W at Wall  - 1-2 x daily - 7 x weekly - 1 sets - 10 reps - 3 sec  hold - Doorway Pec Stretch at 60 Degrees Abduction  - 3 x daily - 7 x weekly - 1 sets - 3 reps - Doorway Pec Stretch at 90 Degrees Abduction  - 3 x daily - 7 x weekly - 1 sets - 3 reps - 30 seconds  hold - Doorway Pec Stretch at 120 Degrees Abduction  - 3 x daily - 7 x weekly - 1 sets - 3 reps - 30 second hold  hold - Standing Infraspinatus/Teres Minor Release with Ball at Wall  - 2 x daily - 7 x weekly - Standing Pectoral Release with Shoulder Abduction with Ball at Wall  - 2-3 x daily - 7 x weekly  Patient Education - Office Posture  ASSESSMENT:  CLINICAL IMPRESSION: Patient is a 58 y.o. female who was seen today for physical therapy evaluation and treatment for chronic L shoulder pain with no known injury. She presents with poor posture and alignment; limited cervical/thoracic mobility; decreased L > R shoulder ROM/mobility; postural weakness; pain with palpation L upper quarter; pain with work and  functional activities and at rest. Patient will benefit from PT to address problems identified.   OBJECTIVE IMPAIRMENTS: decreased activity tolerance, decreased mobility, decreased ROM, decreased strength, increased muscle spasms, impaired UE functional use, improper body mechanics, postural dysfunction, and pain.   ACTIVITY LIMITATIONS: carrying, lifting, sleeping, and reach over head  PARTICIPATION LIMITATIONS: meal prep, cleaning, laundry, and occupation  PERSONAL FACTORS: Behavior pattern, Fitness, Past/current experiences, Profession, Time since onset of injury/illness/exacerbation, and  comorbidities as noted above are also affecting patient's functional outcome.   REHAB POTENTIAL: Good  CLINICAL DECISION MAKING: Evolving/moderate complexity  EVALUATION COMPLEXITY: Moderate   GOALS: Goals reviewed with patient? Yes  SHORT TERM GOALS: Target date: 10/17/2024  Independent in initial HEP  Baseline: Goal status: INITIAL  2.  Patient demonstrates improved thoracic posture and alignment to improve scapular position with activation of posterior shoulder girdle musculature to improve shoulder mechanics  Baseline:  Goal status: INITIAL  3.  Patient verbalizes and demonstrates improved ergonomic positions for work and leisure activities  Baseline: currently at desk/computer work station 8+ hours/day; sitting propped in bed with pillow while reading or gaming on phone  Goal status: INITIAL   LONG TERM GOALS: Target date: 11/14/2024   Decrease pain L shoulder by 80-100% allowing patient to return to all normal functional activities without limitations Baseline:  Goal status: INITIAL  2.  Increase L shoulder AROM to =/> than AROM R shoulder  Baseline:  Goal status: INITIAL  3.  5/5 posterior shoulder girdle strength to improve scapular alignment and therefore improve shoulder function and mechanics  Baseline:  Goal status: INITIAL  4.  Improve Quick DASH score by 10%  indicating improved functional abilities  Baseline: 50% Goal status: INITIAL  5.  Independent in advanced HEP in preparation for discharge to independent home management  Baseline:  Goal status: INITIAL  PLAN:  PT FREQUENCY: 2x/week  PT DURATION: 8 weeks  PLANNED INTERVENTIONS: 97164- PT Re-evaluation, 97110-Therapeutic exercises, 97530- Therapeutic activity, 97112- Neuromuscular re-education, 97535- Self Care, [id number]- Manual therapy, [phone]- Aquatic Therapy, [phone]- Ionotophoresis 4mg /ml Dexamethasone, Patient/Family education, Taping, and Joint mobilization  PLAN FOR NEXT SESSION: review and progress exercise and home program; postural correction; ergonomic education; manual work and modalities as indicated - add pulley for shoulder to prevent further decrease in L shoulder ROM due to increased risk for developing adhesive capsulitis    Salsabeel Gorelick P Ramonte Mena, PT 09/19/2024, 1:29 PM

## 2024-09-19 ENCOUNTER — Ambulatory Visit: Attending: Physician Assistant | Admitting: Rehabilitative and Restorative Service Providers"

## 2024-09-19 ENCOUNTER — Other Ambulatory Visit: Payer: Self-pay

## 2024-09-19 ENCOUNTER — Encounter: Payer: Self-pay | Admitting: Rehabilitative and Restorative Service Providers"

## 2024-09-19 DIAGNOSIS — R293 Abnormal posture: Secondary | ICD-10-CM | POA: Diagnosis present

## 2024-09-19 DIAGNOSIS — R29898 Other symptoms and signs involving the musculoskeletal system: Secondary | ICD-10-CM | POA: Diagnosis present

## 2024-09-19 DIAGNOSIS — M25512 Pain in left shoulder: Secondary | ICD-10-CM | POA: Diagnosis present

## 2024-09-19 DIAGNOSIS — G8929 Other chronic pain: Secondary | ICD-10-CM | POA: Insufficient documentation

## 2024-09-20 ENCOUNTER — Ambulatory Visit: Payer: Self-pay | Admitting: Physician Assistant

## 2024-09-20 DIAGNOSIS — M19012 Primary osteoarthritis, left shoulder: Secondary | ICD-10-CM | POA: Insufficient documentation

## 2024-09-20 NOTE — Progress Notes (Signed)
 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 A targeted injection into joint could be helpful. I could refer you to sports medicine. Are you ok with this?

## 2024-09-26 ENCOUNTER — Ambulatory Visit: Admitting: Rehabilitative and Restorative Service Providers"

## 2024-10-02 ENCOUNTER — Ambulatory Visit: Admitting: Rehabilitative and Restorative Service Providers"

## 2024-10-02 ENCOUNTER — Encounter: Payer: Self-pay | Admitting: Radiology

## 2024-10-10 ENCOUNTER — Ambulatory Visit: Admitting: Rehabilitative and Restorative Service Providers"

## 2024-10-13 ENCOUNTER — Telehealth: Payer: Self-pay

## 2024-10-13 NOTE — Telephone Encounter (Signed)
 Contacted patient and advised we are overdue for a 6 month follow-up for diabetic management. Patient stated she would like to go ahead and schedule that appointment today. Patient has been scheduled for 11/08/2024 @8 :50 am.

## 2024-10-17 ENCOUNTER — Ambulatory Visit: Admitting: Rehabilitative and Restorative Service Providers"

## 2024-10-24 ENCOUNTER — Ambulatory Visit: Admitting: Rehabilitative and Restorative Service Providers"

## 2024-11-08 ENCOUNTER — Ambulatory Visit: Admitting: Physician Assistant

## 2024-11-20 ENCOUNTER — Ambulatory Visit: Admitting: Physician Assistant

## 2024-11-20 ENCOUNTER — Encounter: Payer: Self-pay | Admitting: Physician Assistant

## 2024-11-20 VITALS — BP 132/65 | HR 66 | Ht 60.0 in | Wt 178.0 lb

## 2024-11-20 DIAGNOSIS — Z1211 Encounter for screening for malignant neoplasm of colon: Secondary | ICD-10-CM | POA: Diagnosis not present

## 2024-11-20 DIAGNOSIS — Z6834 Body mass index (BMI) 34.0-34.9, adult: Secondary | ICD-10-CM

## 2024-11-20 DIAGNOSIS — I10 Essential (primary) hypertension: Secondary | ICD-10-CM

## 2024-11-20 DIAGNOSIS — E785 Hyperlipidemia, unspecified: Secondary | ICD-10-CM | POA: Diagnosis not present

## 2024-11-20 DIAGNOSIS — E118 Type 2 diabetes mellitus with unspecified complications: Secondary | ICD-10-CM

## 2024-11-20 DIAGNOSIS — F411 Generalized anxiety disorder: Secondary | ICD-10-CM

## 2024-11-20 DIAGNOSIS — Z7985 Long-term (current) use of injectable non-insulin antidiabetic drugs: Secondary | ICD-10-CM

## 2024-11-20 DIAGNOSIS — F410 Panic disorder [episodic paroxysmal anxiety] without agoraphobia: Secondary | ICD-10-CM | POA: Diagnosis not present

## 2024-11-20 DIAGNOSIS — E1169 Type 2 diabetes mellitus with other specified complication: Secondary | ICD-10-CM

## 2024-11-20 DIAGNOSIS — E6609 Other obesity due to excess calories: Secondary | ICD-10-CM | POA: Diagnosis not present

## 2024-11-20 DIAGNOSIS — E66811 Obesity, class 1: Secondary | ICD-10-CM | POA: Diagnosis not present

## 2024-11-20 DIAGNOSIS — Z23 Encounter for immunization: Secondary | ICD-10-CM | POA: Diagnosis not present

## 2024-11-20 DIAGNOSIS — R4589 Other symptoms and signs involving emotional state: Secondary | ICD-10-CM

## 2024-11-20 LAB — POCT GLYCOSYLATED HEMOGLOBIN (HGB A1C): Hemoglobin A1C: 6.8 % — AB (ref 4.0–5.6)

## 2024-11-20 MED ORDER — LISINOPRIL 10 MG PO TABS: 10.0000 mg | ORAL_TABLET | Freq: Every day | ORAL | 1 refills | Status: AC

## 2024-11-20 MED ORDER — CITALOPRAM HYDROBROMIDE 40 MG PO TABS: 40.0000 mg | ORAL_TABLET | Freq: Every day | ORAL | 4 refills | Status: AC

## 2024-11-20 MED ORDER — SEMAGLUTIDE (1 MG/DOSE) 4 MG/3ML ~~LOC~~ SOPN: 1.0000 mg | PEN_INJECTOR | SUBCUTANEOUS | 0 refills | Status: AC

## 2024-11-20 MED ORDER — ATORVASTATIN CALCIUM 40 MG PO TABS: 40.0000 mg | ORAL_TABLET | Freq: Every day | ORAL | 1 refills | Status: AC

## 2024-11-20 MED ORDER — ALPRAZOLAM 0.5 MG PO TABS: 0.5000 mg | ORAL_TABLET | Freq: Two times a day (BID) | ORAL | 0 refills | Status: AC | PRN

## 2024-11-20 NOTE — Progress Notes (Unsigned)
 "  Established Patient Office Visit  Subjective   Patient ID: Connie Dyer, female    DOB: 06-22-1966  Age: 58 y.o. MRN: [id number]  Chief Complaint  Patient presents with   Medical Management of Chronic Issues    HPI .Discussed the use of AI scribe software for clinical note transcription with the patient, who gave verbal consent to proceed.  History of Present Illness Connie Dyer is a 58 year old female with type 2 diabetes, HTN, HLD who presents for follow-up of her diabetes management.   Glycemic control - Not checking sugars at home - Hemoglobin A1c increased to 6.8% - Discontinued Ozempic  1 mg temporarily due to insurance approval delay; medication has been resumed - Admits to not exercising or eating like she should  Psychosocial stressors - Recent death of father-in-law causing emotional distress, particularly during the holiday season - No mood swings  Cardiopulmonary symptoms - No chest pain - No shortness of breath  Lifestyle - Works from home - Comfortable with current lifestyle     ROS See HPI.    Objective:     BP 132/65   Pulse 66   Ht 5' (1.524 m)   Wt 178 lb (80.7 kg)   LMP 01/09/2019   SpO2 98%   BMI 34.76 kg/m  BP Readings from Last 3 Encounters:  11/20/24 132/65  09/15/24 (!) 140/65  02/18/24 115/75   Wt Readings from Last 3 Encounters:  11/20/24 178 lb (80.7 kg)  09/15/24 177 lb 0.8 oz (80.3 kg)  02/18/24 174 lb 4 oz (79 kg)   ..    09/15/2024    1:17 PM 02/18/2024    8:48 AM 10/19/2022    8:47 AM 07/17/2022    8:20 AM 01/16/2022    8:15 AM  Depression screen PHQ 2/9  Decreased Interest 0 0 1 0 0  Down, Depressed, Hopeless 0 1 1 1  0  PHQ - 2 Score 0 1 2 1  0  Altered sleeping  0 1  0  Tired, decreased energy  1 1  0  Change in appetite  0 0  0  Feeling bad or failure about yourself   0 1  0  Trouble concentrating  0 0  0  Moving slowly or fidgety/restless  0 0  0  Suicidal thoughts  0 0  0  PHQ-9 Score  2   5   0   Difficult doing work/chores   Not difficult at all  Not difficult at all     Data saved with a previous flowsheet row definition   ..    02/18/2024    8:48 AM 10/19/2022    8:47 AM 01/16/2022    8:15 AM 09/15/2021    3:48 PM  GAD 7 : Generalized Anxiety Score  Nervous, Anxious, on Edge 1 1 1 2   Control/stop worrying 0 0 0 1  Worry too much - different things 0 0 0 1  Trouble relaxing 1 0 0 1  Restless 0 0 0 0  Easily annoyed or irritable 1 1 0 1  Afraid - awful might happen 0 0 0 0  Total GAD 7 Score 3 2 1 6   Anxiety Difficulty Somewhat difficult Not difficult at all Not difficult at all Not difficult at all       Physical Exam Constitutional:      Appearance: Normal appearance. She is obese.  HENT:     Head: Normocephalic.  Cardiovascular:     Rate and  Rhythm: Normal rate and regular rhythm.  Pulmonary:     Effort: Pulmonary effort is normal.     Breath sounds: Normal breath sounds.  Musculoskeletal:     Cervical back: Normal range of motion and neck supple. No tenderness.     Right lower leg: No edema.     Left lower leg: No edema.  Lymphadenopathy:     Cervical: No cervical adenopathy.  Neurological:     General: No focal deficit present.     Mental Status: She is alert and oriented to person, place, and time.  Psychiatric:        Mood and Affect: Mood normal.      Results for orders placed or performed in visit on 11/20/24  POCT HgB A1C  Result Value Ref Range   Hemoglobin A1C 6.8 (A) 4.0 - 5.6 %   HbA1c POC (<> result, manual entry)     HbA1c, POC (prediabetic range)     HbA1c, POC (controlled diabetic range)       The 10-year ASCVD risk score (Arnett DK, et al., 2019) is: 7.8%    Assessment & Plan:  Connie Dyer was seen today for medical management of chronic issues.  Diagnoses and all orders for this visit:  Hyperlipidemia associated with type 2 diabetes mellitus (HCC) -     POCT HgB A1C -     Semaglutide , 1 MG/DOSE, 4  MG/3ML SOPN; Inject 1 mg as directed once a week. -     CMP14+EGFR  GAD (generalized anxiety disorder) -     citalopram  (CELEXA ) 40 MG tablet; Take 1 tablet (40 mg total) by mouth daily. -     CMP14+EGFR  Depressed mood -     citalopram  (CELEXA ) 40 MG tablet; Take 1 tablet (40 mg total) by mouth daily. -     CMP14+EGFR  Colon cancer screening -     Ambulatory referral to Gastroenterology  Dyslipidemia, goal LDL below 70 -     atorvastatin  (LIPITOR) 40 MG tablet; Take 1 tablet (40 mg total) by mouth daily. -     CMP14+EGFR  Essential hypertension -     lisinopril  (ZESTRIL ) 10 MG tablet; Take 1 tablet (10 mg total) by mouth daily. -     CMP14+EGFR  Panic attack -     ALPRAZolam  (XANAX ) 0.5 MG tablet; Take 1 tablet (0.5 mg total) by mouth 2 (two) times daily as needed for anxiety. -     CMP14+EGFR  Immunization due -     Flu vaccine trivalent PF, 6mos and older(Flulaval,Afluria,Fluarix,Fluzone)  Class 1 obesity due to excess calories with serious comorbidity and body mass index (BMI) of 34.0 to 34.9 in adult   Assessment & Plan Type 2 diabetes mellitus associated with hyperlipidemia A1c increased to 6.8, indicating suboptimal glycemic control. Lapse in Ozempic  due to insurance. No home glucose monitoring. - Refilled Ozempic  1 mg with 90-day supply if insurance approves. - Encouraged increased physical activity and reduction of sugar and carbohydrate intake. - Encouraged to schedule eye exam - microalbumin UTD - GFR ordered today in labs - BP to goal, refilled lisinopril  - on STATIN - Schedule A1c test in 3 months.  Depressed mood Mood affected by family bereavement and holiday stress. Current mood stable. - refilled celexa  daily and xanax  as needed.   General Health Maintenance Received flu shot. Needs eye exam for diabetes management. Colonoscopy recommended but declined but then states she will consider.  - Schedule eye exam for diabetes management. - Encouraged  colonoscopy for routine health maintenance, referral placed today.  - Mammogram already scheduled.      Return in about 3 months (around 02/18/2025) for DM follow up.    Connie Scism, PA-C  "

## 2024-11-21 ENCOUNTER — Ambulatory Visit: Payer: Self-pay | Admitting: Physician Assistant

## 2024-11-21 DIAGNOSIS — E6609 Other obesity due to excess calories: Secondary | ICD-10-CM | POA: Insufficient documentation

## 2024-11-21 DIAGNOSIS — E1169 Type 2 diabetes mellitus with other specified complication: Secondary | ICD-10-CM | POA: Insufficient documentation

## 2024-11-21 LAB — CMP14+EGFR
ALT: 43 IU/L — ABNORMAL HIGH (ref 0–32)
AST: 32 IU/L (ref 0–40)
Albumin: 4.6 g/dL (ref 3.8–4.9)
Alkaline Phosphatase: 29 IU/L — ABNORMAL LOW (ref 49–135)
BUN/Creatinine Ratio: 19 (ref 12–28)
BUN: 15 mg/dL (ref 8–27)
Bilirubin Total: 0.3 mg/dL (ref 0.0–1.2)
CO2: 24 mmol/L (ref 20–29)
Calcium: 9.8 mg/dL (ref 8.7–10.3)
Chloride: 97 mmol/L (ref 96–106)
Creatinine, Ser: 0.8 mg/dL (ref 0.57–1.00)
Globulin, Total: 2.6 g/dL (ref 1.5–4.5)
Glucose: 247 mg/dL — ABNORMAL HIGH (ref 70–99)
Potassium: 4.4 mmol/L (ref 3.5–5.2)
Sodium: 138 mmol/L (ref 134–144)
Total Protein: 7.2 g/dL (ref 6.0–8.5)
eGFR: 84 mL/min/1.73

## 2024-11-21 NOTE — Progress Notes (Signed)
 Liver enzyme up some. Lets get you back on ozempic  regularly, get sugars down and recheck this. Make sure to avoid tylenol products and alcohol .

## 2025-02-20 ENCOUNTER — Ambulatory Visit: Admitting: Physician Assistant
